# Patient Record
Sex: Female | Born: 1990 | Race: White | Hispanic: No | Marital: Married | Smoking: Never smoker
Health system: Southern US, Community
[De-identification: ages and names within clinical notes are randomized; demographics above are authoritative.]

## PROBLEM LIST (undated history)

## (undated) DIAGNOSIS — F419 Anxiety disorder, unspecified: Secondary | ICD-10-CM

## (undated) DIAGNOSIS — R112 Nausea with vomiting, unspecified: Secondary | ICD-10-CM

## (undated) DIAGNOSIS — K219 Gastro-esophageal reflux disease without esophagitis: Secondary | ICD-10-CM

## (undated) HISTORY — PX: BREAST SURGERY: SHX581

## (undated) HISTORY — PX: WISDOM TOOTH EXTRACTION: SHX21

---
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
## 2012-06-11 ENCOUNTER — Other Ambulatory Visit: Payer: Self-pay | Admitting: Physician Assistant

## 2012-06-13 LAB — CLOSTRIDIUM DIFFICILE BY PCR

## 2013-01-30 ENCOUNTER — Ambulatory Visit: Payer: Self-pay | Admitting: Internal Medicine

## 2014-12-26 ENCOUNTER — Ambulatory Visit: Payer: Self-pay

## 2016-03-31 ENCOUNTER — Other Ambulatory Visit: Payer: Self-pay | Admitting: Internal Medicine

## 2016-03-31 DIAGNOSIS — M545 Low back pain: Secondary | ICD-10-CM

## 2016-04-13 ENCOUNTER — Encounter: Payer: Self-pay | Admitting: Radiology

## 2016-04-13 ENCOUNTER — Ambulatory Visit
Admission: RE | Admit: 2016-04-13 | Discharge: 2016-04-13 | Disposition: A | Discharge status: Home / Self Care | Payer: Managed Care, Other (non HMO) | Source: Ambulatory Visit | Attending: Internal Medicine | Admitting: Internal Medicine

## 2016-04-13 DIAGNOSIS — M545 Low back pain: Secondary | ICD-10-CM

## 2016-04-13 DIAGNOSIS — M5136 Other intervertebral disc degeneration, lumbar region: Secondary | ICD-10-CM | POA: Insufficient documentation

## 2016-04-13 DIAGNOSIS — M48061 Spinal stenosis, lumbar region without neurogenic claudication: Secondary | ICD-10-CM | POA: Insufficient documentation

## 2017-03-24 DIAGNOSIS — R300 Dysuria: Secondary | ICD-10-CM | POA: Diagnosis not present

## 2017-03-28 DIAGNOSIS — J029 Acute pharyngitis, unspecified: Secondary | ICD-10-CM | POA: Diagnosis not present

## 2017-04-26 DIAGNOSIS — M5136 Other intervertebral disc degeneration, lumbar region: Secondary | ICD-10-CM | POA: Diagnosis not present

## 2017-04-26 DIAGNOSIS — M5416 Radiculopathy, lumbar region: Secondary | ICD-10-CM | POA: Diagnosis not present

## 2017-05-12 DIAGNOSIS — M5136 Other intervertebral disc degeneration, lumbar region: Secondary | ICD-10-CM | POA: Diagnosis not present

## 2017-05-12 DIAGNOSIS — M5441 Lumbago with sciatica, right side: Secondary | ICD-10-CM | POA: Diagnosis not present

## 2017-05-15 ENCOUNTER — Other Ambulatory Visit: Payer: Self-pay | Admitting: Family Medicine

## 2017-05-15 DIAGNOSIS — M5441 Lumbago with sciatica, right side: Secondary | ICD-10-CM

## 2017-05-15 DIAGNOSIS — M5136 Other intervertebral disc degeneration, lumbar region: Secondary | ICD-10-CM

## 2017-05-20 ENCOUNTER — Ambulatory Visit
Admission: RE | Admit: 2017-05-20 | Discharge: 2017-05-20 | Disposition: A | Discharge status: Home / Self Care | Payer: 59 | Source: Ambulatory Visit | Attending: Family Medicine | Admitting: Family Medicine

## 2017-05-20 DIAGNOSIS — M5441 Lumbago with sciatica, right side: Secondary | ICD-10-CM

## 2017-05-20 DIAGNOSIS — M5126 Other intervertebral disc displacement, lumbar region: Secondary | ICD-10-CM | POA: Diagnosis not present

## 2017-05-20 DIAGNOSIS — M5136 Other intervertebral disc degeneration, lumbar region: Secondary | ICD-10-CM | POA: Diagnosis present

## 2017-05-20 DIAGNOSIS — M545 Low back pain: Secondary | ICD-10-CM | POA: Diagnosis not present

## 2017-05-20 DIAGNOSIS — M48061 Spinal stenosis, lumbar region without neurogenic claudication: Secondary | ICD-10-CM | POA: Diagnosis not present

## 2017-05-24 DIAGNOSIS — M5136 Other intervertebral disc degeneration, lumbar region: Secondary | ICD-10-CM | POA: Diagnosis not present

## 2017-05-24 DIAGNOSIS — M5416 Radiculopathy, lumbar region: Secondary | ICD-10-CM | POA: Diagnosis not present

## 2017-05-24 DIAGNOSIS — M62838 Other muscle spasm: Secondary | ICD-10-CM | POA: Diagnosis not present

## 2017-06-19 DIAGNOSIS — G8929 Other chronic pain: Secondary | ICD-10-CM | POA: Diagnosis not present

## 2017-06-19 DIAGNOSIS — M545 Low back pain: Secondary | ICD-10-CM | POA: Diagnosis not present

## 2017-06-23 DIAGNOSIS — M5416 Radiculopathy, lumbar region: Secondary | ICD-10-CM | POA: Diagnosis not present

## 2017-06-23 DIAGNOSIS — M5136 Other intervertebral disc degeneration, lumbar region: Secondary | ICD-10-CM | POA: Diagnosis not present

## 2017-07-05 DIAGNOSIS — R202 Paresthesia of skin: Secondary | ICD-10-CM | POA: Diagnosis not present

## 2017-07-05 DIAGNOSIS — M542 Cervicalgia: Secondary | ICD-10-CM | POA: Diagnosis not present

## 2017-07-05 DIAGNOSIS — M5136 Other intervertebral disc degeneration, lumbar region: Secondary | ICD-10-CM | POA: Diagnosis not present

## 2017-07-05 DIAGNOSIS — M5441 Lumbago with sciatica, right side: Secondary | ICD-10-CM | POA: Diagnosis not present

## 2017-07-11 DIAGNOSIS — M5117 Intervertebral disc disorders with radiculopathy, lumbosacral region: Secondary | ICD-10-CM | POA: Diagnosis not present

## 2017-07-11 DIAGNOSIS — M9903 Segmental and somatic dysfunction of lumbar region: Secondary | ICD-10-CM | POA: Diagnosis not present

## 2017-07-11 DIAGNOSIS — M47817 Spondylosis without myelopathy or radiculopathy, lumbosacral region: Secondary | ICD-10-CM | POA: Diagnosis not present

## 2017-07-13 DIAGNOSIS — M5117 Intervertebral disc disorders with radiculopathy, lumbosacral region: Secondary | ICD-10-CM | POA: Diagnosis not present

## 2017-07-13 DIAGNOSIS — M9903 Segmental and somatic dysfunction of lumbar region: Secondary | ICD-10-CM | POA: Diagnosis not present

## 2017-07-13 DIAGNOSIS — M47817 Spondylosis without myelopathy or radiculopathy, lumbosacral region: Secondary | ICD-10-CM | POA: Diagnosis not present

## 2017-07-14 DIAGNOSIS — M5136 Other intervertebral disc degeneration, lumbar region: Secondary | ICD-10-CM | POA: Diagnosis not present

## 2017-07-14 DIAGNOSIS — M62838 Other muscle spasm: Secondary | ICD-10-CM | POA: Diagnosis not present

## 2017-07-14 DIAGNOSIS — M5416 Radiculopathy, lumbar region: Secondary | ICD-10-CM | POA: Diagnosis not present

## 2017-07-17 DIAGNOSIS — M5117 Intervertebral disc disorders with radiculopathy, lumbosacral region: Secondary | ICD-10-CM | POA: Diagnosis not present

## 2017-07-17 DIAGNOSIS — M47817 Spondylosis without myelopathy or radiculopathy, lumbosacral region: Secondary | ICD-10-CM | POA: Diagnosis not present

## 2017-07-17 DIAGNOSIS — M9903 Segmental and somatic dysfunction of lumbar region: Secondary | ICD-10-CM | POA: Diagnosis not present

## 2017-07-20 DIAGNOSIS — M47817 Spondylosis without myelopathy or radiculopathy, lumbosacral region: Secondary | ICD-10-CM | POA: Diagnosis not present

## 2017-07-20 DIAGNOSIS — G8929 Other chronic pain: Secondary | ICD-10-CM | POA: Diagnosis not present

## 2017-07-20 DIAGNOSIS — M5117 Intervertebral disc disorders with radiculopathy, lumbosacral region: Secondary | ICD-10-CM | POA: Diagnosis not present

## 2017-07-20 DIAGNOSIS — M5441 Lumbago with sciatica, right side: Secondary | ICD-10-CM | POA: Diagnosis not present

## 2017-07-20 DIAGNOSIS — M5442 Lumbago with sciatica, left side: Secondary | ICD-10-CM | POA: Diagnosis not present

## 2017-07-20 DIAGNOSIS — M9903 Segmental and somatic dysfunction of lumbar region: Secondary | ICD-10-CM | POA: Diagnosis not present

## 2017-07-24 DIAGNOSIS — M5117 Intervertebral disc disorders with radiculopathy, lumbosacral region: Secondary | ICD-10-CM | POA: Diagnosis not present

## 2017-07-24 DIAGNOSIS — M9903 Segmental and somatic dysfunction of lumbar region: Secondary | ICD-10-CM | POA: Diagnosis not present

## 2017-07-24 DIAGNOSIS — M47817 Spondylosis without myelopathy or radiculopathy, lumbosacral region: Secondary | ICD-10-CM | POA: Diagnosis not present

## 2017-07-27 DIAGNOSIS — M9903 Segmental and somatic dysfunction of lumbar region: Secondary | ICD-10-CM | POA: Diagnosis not present

## 2017-07-27 DIAGNOSIS — M5117 Intervertebral disc disorders with radiculopathy, lumbosacral region: Secondary | ICD-10-CM | POA: Diagnosis not present

## 2017-07-27 DIAGNOSIS — M47817 Spondylosis without myelopathy or radiculopathy, lumbosacral region: Secondary | ICD-10-CM | POA: Diagnosis not present

## 2017-07-31 DIAGNOSIS — M545 Low back pain: Secondary | ICD-10-CM | POA: Diagnosis not present

## 2017-07-31 DIAGNOSIS — G8929 Other chronic pain: Secondary | ICD-10-CM | POA: Diagnosis not present

## 2017-07-31 DIAGNOSIS — M5117 Intervertebral disc disorders with radiculopathy, lumbosacral region: Secondary | ICD-10-CM | POA: Diagnosis not present

## 2017-07-31 DIAGNOSIS — M47817 Spondylosis without myelopathy or radiculopathy, lumbosacral region: Secondary | ICD-10-CM | POA: Diagnosis not present

## 2017-07-31 DIAGNOSIS — M9903 Segmental and somatic dysfunction of lumbar region: Secondary | ICD-10-CM | POA: Diagnosis not present

## 2017-08-03 DIAGNOSIS — M47817 Spondylosis without myelopathy or radiculopathy, lumbosacral region: Secondary | ICD-10-CM | POA: Diagnosis not present

## 2017-08-03 DIAGNOSIS — M9903 Segmental and somatic dysfunction of lumbar region: Secondary | ICD-10-CM | POA: Diagnosis not present

## 2017-08-03 DIAGNOSIS — M5117 Intervertebral disc disorders with radiculopathy, lumbosacral region: Secondary | ICD-10-CM | POA: Diagnosis not present

## 2017-08-07 DIAGNOSIS — M9903 Segmental and somatic dysfunction of lumbar region: Secondary | ICD-10-CM | POA: Diagnosis not present

## 2017-08-07 DIAGNOSIS — M47817 Spondylosis without myelopathy or radiculopathy, lumbosacral region: Secondary | ICD-10-CM | POA: Diagnosis not present

## 2017-08-07 DIAGNOSIS — M5117 Intervertebral disc disorders with radiculopathy, lumbosacral region: Secondary | ICD-10-CM | POA: Diagnosis not present

## 2017-08-08 DIAGNOSIS — M545 Low back pain: Secondary | ICD-10-CM | POA: Diagnosis not present

## 2017-08-08 DIAGNOSIS — G8929 Other chronic pain: Secondary | ICD-10-CM | POA: Diagnosis not present

## 2017-08-10 DIAGNOSIS — M545 Low back pain: Secondary | ICD-10-CM | POA: Diagnosis not present

## 2017-08-10 DIAGNOSIS — G8929 Other chronic pain: Secondary | ICD-10-CM | POA: Diagnosis not present

## 2017-08-15 DIAGNOSIS — M5117 Intervertebral disc disorders with radiculopathy, lumbosacral region: Secondary | ICD-10-CM | POA: Diagnosis not present

## 2017-08-15 DIAGNOSIS — M47817 Spondylosis without myelopathy or radiculopathy, lumbosacral region: Secondary | ICD-10-CM | POA: Diagnosis not present

## 2017-08-15 DIAGNOSIS — M9903 Segmental and somatic dysfunction of lumbar region: Secondary | ICD-10-CM | POA: Diagnosis not present

## 2017-08-16 DIAGNOSIS — G8929 Other chronic pain: Secondary | ICD-10-CM | POA: Diagnosis not present

## 2017-08-16 DIAGNOSIS — M545 Low back pain: Secondary | ICD-10-CM | POA: Diagnosis not present

## 2017-08-21 DIAGNOSIS — G8929 Other chronic pain: Secondary | ICD-10-CM | POA: Diagnosis not present

## 2017-08-21 DIAGNOSIS — M545 Low back pain: Secondary | ICD-10-CM | POA: Diagnosis not present

## 2017-08-25 DIAGNOSIS — M545 Low back pain: Secondary | ICD-10-CM | POA: Diagnosis not present

## 2017-08-25 DIAGNOSIS — G8929 Other chronic pain: Secondary | ICD-10-CM | POA: Diagnosis not present

## 2017-08-28 DIAGNOSIS — M545 Low back pain: Secondary | ICD-10-CM | POA: Diagnosis not present

## 2017-08-28 DIAGNOSIS — G8929 Other chronic pain: Secondary | ICD-10-CM | POA: Diagnosis not present

## 2017-08-31 DIAGNOSIS — M9903 Segmental and somatic dysfunction of lumbar region: Secondary | ICD-10-CM | POA: Diagnosis not present

## 2017-08-31 DIAGNOSIS — G8929 Other chronic pain: Secondary | ICD-10-CM | POA: Diagnosis not present

## 2017-08-31 DIAGNOSIS — M545 Low back pain: Secondary | ICD-10-CM | POA: Diagnosis not present

## 2017-08-31 DIAGNOSIS — M47817 Spondylosis without myelopathy or radiculopathy, lumbosacral region: Secondary | ICD-10-CM | POA: Diagnosis not present

## 2017-08-31 DIAGNOSIS — M5117 Intervertebral disc disorders with radiculopathy, lumbosacral region: Secondary | ICD-10-CM | POA: Diagnosis not present

## 2017-09-14 DIAGNOSIS — M47817 Spondylosis without myelopathy or radiculopathy, lumbosacral region: Secondary | ICD-10-CM | POA: Diagnosis not present

## 2017-09-14 DIAGNOSIS — M9903 Segmental and somatic dysfunction of lumbar region: Secondary | ICD-10-CM | POA: Diagnosis not present

## 2017-09-14 DIAGNOSIS — M5117 Intervertebral disc disorders with radiculopathy, lumbosacral region: Secondary | ICD-10-CM | POA: Diagnosis not present

## 2017-09-29 DIAGNOSIS — M545 Low back pain: Secondary | ICD-10-CM | POA: Diagnosis not present

## 2017-09-29 DIAGNOSIS — G8929 Other chronic pain: Secondary | ICD-10-CM | POA: Diagnosis not present

## 2017-09-29 DIAGNOSIS — M25551 Pain in right hip: Secondary | ICD-10-CM | POA: Diagnosis not present

## 2017-10-03 DIAGNOSIS — M9903 Segmental and somatic dysfunction of lumbar region: Secondary | ICD-10-CM | POA: Diagnosis not present

## 2017-10-03 DIAGNOSIS — M47817 Spondylosis without myelopathy or radiculopathy, lumbosacral region: Secondary | ICD-10-CM | POA: Diagnosis not present

## 2017-10-03 DIAGNOSIS — M5117 Intervertebral disc disorders with radiculopathy, lumbosacral region: Secondary | ICD-10-CM | POA: Diagnosis not present

## 2017-10-06 DIAGNOSIS — M545 Low back pain: Secondary | ICD-10-CM | POA: Diagnosis not present

## 2017-10-06 DIAGNOSIS — G8929 Other chronic pain: Secondary | ICD-10-CM | POA: Diagnosis not present

## 2017-10-06 DIAGNOSIS — M533 Sacrococcygeal disorders, not elsewhere classified: Secondary | ICD-10-CM | POA: Diagnosis not present

## 2017-10-09 DIAGNOSIS — Z23 Encounter for immunization: Secondary | ICD-10-CM | POA: Diagnosis not present

## 2017-10-09 DIAGNOSIS — R1084 Generalized abdominal pain: Secondary | ICD-10-CM | POA: Diagnosis not present

## 2017-10-09 DIAGNOSIS — M545 Low back pain: Secondary | ICD-10-CM | POA: Diagnosis not present

## 2017-10-11 ENCOUNTER — Other Ambulatory Visit: Payer: Self-pay | Admitting: Internal Medicine

## 2017-10-11 DIAGNOSIS — R100 Acute abdomen: Secondary | ICD-10-CM

## 2017-10-13 DIAGNOSIS — M533 Sacrococcygeal disorders, not elsewhere classified: Secondary | ICD-10-CM | POA: Diagnosis not present

## 2017-10-13 DIAGNOSIS — M545 Low back pain: Secondary | ICD-10-CM | POA: Diagnosis not present

## 2017-10-13 DIAGNOSIS — G8929 Other chronic pain: Secondary | ICD-10-CM | POA: Diagnosis not present

## 2017-11-21 DIAGNOSIS — M545 Low back pain: Secondary | ICD-10-CM | POA: Diagnosis not present

## 2017-11-21 DIAGNOSIS — G8929 Other chronic pain: Secondary | ICD-10-CM | POA: Diagnosis not present

## 2017-12-07 DIAGNOSIS — M47817 Spondylosis without myelopathy or radiculopathy, lumbosacral region: Secondary | ICD-10-CM | POA: Diagnosis not present

## 2017-12-07 DIAGNOSIS — M5117 Intervertebral disc disorders with radiculopathy, lumbosacral region: Secondary | ICD-10-CM | POA: Diagnosis not present

## 2017-12-07 DIAGNOSIS — M9903 Segmental and somatic dysfunction of lumbar region: Secondary | ICD-10-CM | POA: Diagnosis not present

## 2017-12-14 DIAGNOSIS — M461 Sacroiliitis, not elsewhere classified: Secondary | ICD-10-CM | POA: Diagnosis not present

## 2018-01-09 DIAGNOSIS — O418X10 Other specified disorders of amniotic fluid and membranes, first trimester, not applicable or unspecified: Secondary | ICD-10-CM | POA: Diagnosis not present

## 2018-01-09 DIAGNOSIS — N912 Amenorrhea, unspecified: Secondary | ICD-10-CM | POA: Diagnosis not present

## 2018-01-09 DIAGNOSIS — O468X1 Other antepartum hemorrhage, first trimester: Secondary | ICD-10-CM | POA: Diagnosis not present

## 2018-02-08 DIAGNOSIS — O418X10 Other specified disorders of amniotic fluid and membranes, first trimester, not applicable or unspecified: Secondary | ICD-10-CM | POA: Diagnosis not present

## 2018-02-08 DIAGNOSIS — Z3401 Encounter for supervision of normal first pregnancy, first trimester: Secondary | ICD-10-CM | POA: Diagnosis not present

## 2018-02-08 DIAGNOSIS — O468X1 Other antepartum hemorrhage, first trimester: Secondary | ICD-10-CM | POA: Diagnosis not present

## 2018-03-05 DIAGNOSIS — J029 Acute pharyngitis, unspecified: Secondary | ICD-10-CM | POA: Diagnosis not present

## 2018-03-17 ENCOUNTER — Other Ambulatory Visit: Payer: Self-pay

## 2018-03-17 ENCOUNTER — Emergency Department: Payer: 59

## 2018-03-17 ENCOUNTER — Emergency Department
Admission: EM | Admit: 2018-03-17 | Discharge: 2018-03-17 | Disposition: A | Discharge status: Home / Self Care | Payer: 59 | Attending: Emergency Medicine | Admitting: Emergency Medicine

## 2018-03-17 DIAGNOSIS — R0602 Shortness of breath: Secondary | ICD-10-CM | POA: Insufficient documentation

## 2018-03-17 DIAGNOSIS — R0981 Nasal congestion: Secondary | ICD-10-CM | POA: Diagnosis not present

## 2018-03-17 DIAGNOSIS — Z3492 Encounter for supervision of normal pregnancy, unspecified, second trimester: Secondary | ICD-10-CM | POA: Diagnosis not present

## 2018-03-17 DIAGNOSIS — Z3A16 16 weeks gestation of pregnancy: Secondary | ICD-10-CM | POA: Insufficient documentation

## 2018-03-17 DIAGNOSIS — R42 Dizziness and giddiness: Secondary | ICD-10-CM | POA: Diagnosis not present

## 2018-03-17 DIAGNOSIS — R05 Cough: Secondary | ICD-10-CM | POA: Insufficient documentation

## 2018-03-17 DIAGNOSIS — O99512 Diseases of the respiratory system complicating pregnancy, second trimester: Secondary | ICD-10-CM | POA: Diagnosis not present

## 2018-03-17 DIAGNOSIS — B9789 Other viral agents as the cause of diseases classified elsewhere: Secondary | ICD-10-CM | POA: Diagnosis not present

## 2018-03-17 DIAGNOSIS — O9989 Other specified diseases and conditions complicating pregnancy, childbirth and the puerperium: Secondary | ICD-10-CM | POA: Diagnosis not present

## 2018-03-17 DIAGNOSIS — J069 Acute upper respiratory infection, unspecified: Secondary | ICD-10-CM

## 2018-03-17 LAB — CBC
HCT: 38.1 % (ref 36.0–46.0)
Hemoglobin: 12.9 g/dL (ref 12.0–15.0)
MCH: 30.4 pg (ref 26.0–34.0)
MCHC: 33.9 g/dL (ref 30.0–36.0)
MCV: 89.9 fL (ref 80.0–100.0)
Platelets: 303 10*3/uL (ref 150–400)
RBC: 4.24 MIL/uL (ref 3.87–5.11)
RDW: 12.4 % (ref 11.5–15.5)
WBC: 11.2 10*3/uL — ABNORMAL HIGH (ref 4.0–10.5)
nRBC: 0 % (ref 0.0–0.2)

## 2018-03-17 LAB — URINALYSIS, COMPLETE (UACMP) WITH MICROSCOPIC
Bilirubin Urine: NEGATIVE
Glucose, UA: NEGATIVE mg/dL
Hgb urine dipstick: NEGATIVE
Ketones, ur: NEGATIVE mg/dL
Leukocytes, UA: NEGATIVE
Nitrite: NEGATIVE
Protein, ur: NEGATIVE mg/dL
SPECIFIC GRAVITY, URINE: 1.014 (ref 1.005–1.030)
pH: 6 (ref 5.0–8.0)

## 2018-03-17 LAB — HCG, QUANTITATIVE, PREGNANCY: hCG, Beta Chain, Quant, S: 20378 m[IU]/mL — ABNORMAL HIGH (ref <5)

## 2018-03-17 LAB — BASIC METABOLIC PANEL
Anion gap: 7 (ref 5–15)
BUN: 10 mg/dL (ref 6–20)
CO2: 21 mmol/L — ABNORMAL LOW (ref 22–32)
Calcium: 9.1 mg/dL (ref 8.9–10.3)
Chloride: 106 mmol/L (ref 98–111)
Creatinine, Ser: 0.54 mg/dL (ref 0.44–1.00)
GFR calc Af Amer: >60 mL/min (ref >60)
Glucose, Bld: 90 mg/dL (ref 70–99)
Potassium: 3.6 mmol/L (ref 3.5–5.1)
Sodium: 134 mmol/L — ABNORMAL LOW (ref 135–145)

## 2018-03-17 LAB — INFLUENZA PANEL BY PCR (TYPE A & B)
INFLBPCR: NEGATIVE
Influenza A By PCR: NEGATIVE

## 2018-03-17 LAB — TROPONIN I: Troponin I: <0.03 ng/mL (ref <0.03)

## 2018-03-17 MED ORDER — SODIUM CHLORIDE 0.9 % IV BOLUS
1000.0000 mL | Freq: Once | INTRAVENOUS | Status: AC
  Administered 2018-03-17: 1000 mL via INTRAVENOUS

## 2018-03-17 NOTE — ED Provider Notes (Signed)
[facility] Emergency Department Provider Note  ____________________________________________  Time seen: Approximately 3:01 PM  I have reviewed the triage vital signs and the nursing notes.   HISTORY  Chief Complaint Shortness of Breath; Dizziness; and Cough   HPI Molly Morton is a 27 y.o. female G1, P0 at 2716 weeks gestational age with no significant past medical history who presents for evaluation of dizziness.  Patient reports that she has had 2 weeks of cold-like symptoms with congestion, postnasal drip, cough.  She finished a course of Z-Pak that was given to her by her OB/GYN yesterday.  She reports that her symptoms improved but she still congested and is still having a dry cough which is usually worse at nighttime due to postnasal drip.  This morning when she woke up she reports feeling like she was drunk/off balance.  She has been having intermittent episodes of feeling like that throughout the day today.  She reports that every time she feels like that she gets very anxious.  She does have a history of anxiety.  She reports hyperventilating which makes her feel short of breath and she develops chest pain.  She reports that when she calms down her shortness of breath and chest pain go away.  She denies fever or chills, nausea, vomiting, diarrhea, vaginal bleeding or vaginal discharge.  No dysuria or hematuria.  No personal or family history of blood clots, recent travel immobilization, leg pain or swelling, hemoptysis, exogenous hormones, or history of cancer.  Patient does report feeling that her anxiety is more pronounced since she became pregnant.  She is currently not on any medications for it.  PMH Anxiety  Allergies Patient has no allergy information on record.  FH No PE/ DVT  Social History Smoking - no Alcohol - no Drug - no   Review of Systems  Constitutional: Negative for fever. + dizziness Eyes: Negative for visual changes. ENT: +  congestion. Neck: No neck pain  Cardiovascular: Negative for chest pain. Respiratory: Negative for shortness of breath. + cough Gastrointestinal: Negative for abdominal pain, vomiting or diarrhea. Genitourinary: Negative for dysuria. Musculoskeletal: Negative for back pain. Skin: Negative for rash. Neurological: Negative for headaches, weakness or numbness. Psych: No SI or HI. + anxiety  ____________________________________________   PHYSICAL EXAM:  VITAL SIGNS: ED Triage Vitals  Enc Vitals Group     BP 03/17/18 1107 131/78     Pulse Rate 03/17/18 1107 86     Resp 03/17/18 1107 18     Temp 03/17/18 1107 98.2 F (36.8 C)     Temp Source 03/17/18 1107 Oral     SpO2 03/17/18 1107 98 %     Weight 03/17/18 1104 190 lb (86.2 kg)     Height 03/17/18 1104 5\' 6"  (1.676 m)     Head Circumference --      Peak Flow --      Pain Score 03/17/18 1104 0     Pain Loc --      Pain Edu? --      Excl. in GC? --     Constitutional: Alert and oriented. Well appearing and in no apparent distress. HEENT:      Head: Normocephalic and atraumatic.         Eyes: Conjunctivae are normal. Sclera is non-icteric.       Mouth/Throat: Mucous membranes are moist.       Ears: Bilateral TMs visualized and clear      Neck: Supple with no  signs of meningismus. Cardiovascular: Regular rate and rhythm. No murmurs, gallops, or rubs. 2+ symmetrical distal pulses are present in all extremities. No JVD. Respiratory: Normal respiratory effort. Lungs are clear to auscultation bilaterally. No wheezes, crackles, or rhonchi.  Gastrointestinal: Soft, non tender, and non distended with positive bowel sounds. No rebound or guarding. Musculoskeletal: Nontender with normal range of motion in all extremities. No edema, cyanosis, or erythema of extremities. Neurologic: Normal speech and language. Face is symmetric. Moving all extremities. No gross focal neurologic deficits are appreciated. Skin: Skin is warm, dry and intact.  No rash noted. Psychiatric: Mood and affect are normal. Speech and behavior are normal.  ____________________________________________   LABS (all labs ordered are listed, but only abnormal results are displayed)  Labs Reviewed  BASIC METABOLIC PANEL - Abnormal; Notable for the following components:      Result Value   Sodium 134 (*)    CO2 21 (*)    All other components within normal limits  CBC - Abnormal; Notable for the following components:   WBC 11.2 (*)    All other components within normal limits  URINALYSIS, COMPLETE (UACMP) WITH MICROSCOPIC - Abnormal; Notable for the following components:   Color, Urine YELLOW (*)    APPearance CLEAR (*)    Bacteria, UA RARE (*)    All other components within normal limits  HCG, QUANTITATIVE, PREGNANCY - Abnormal; Notable for the following components:   hCG, Beta Chain, Quant, S 20,378 (*)    All other components within normal limits  TROPONIN I  INFLUENZA PANEL BY PCR (TYPE A & B)   ____________________________________________  EKG  ED ECG REPORT I, Nita Sicklearolina Darly Fails, the attending physician, personally viewed and interpreted this ECG.  Normal sinus rhythm, rate of 83, normal intervals, normal axis, no ST elevations or depressions.  Normal EKG.  No prior for comparison. ____________________________________________  RADIOLOGY  I have personally reviewed the images performed during this visit and I agree with the Radiologist's read.   Interpretation by Radiologist:  Dg Chest 2 View  Result Date: 03/17/2018 CLINICAL DATA:  Patient with shortness of breath. EXAM: CHEST - 2 VIEW COMPARISON:  None. FINDINGS: Normal cardiac and mediastinal contours. No consolidative pulmonary opacities. No pleural effusion or pneumothorax. Thoracic spine degenerative changes. IMPRESSION: No acute cardiopulmonary process. Electronically Signed   By: Annia Beltrew  Davis M.D.   On: 03/17/2018 12:21    ____________________________________________   PROCEDURES  Procedure(s) performed: None Procedures Critical Care performed:  None ____________________________________________   INITIAL IMPRESSION / ASSESSMENT AND PLAN / ED COURSE  27 y.o. female G1, P0 at 2716 weeks gestational age with no significant past medical history who presents for evaluation of dizziness, cough, congestion, anxiety.  Patient is extremely well-appearing in no distress, normal work of breathing, normal sats, lungs are clear to auscultation, she looks well-hydrated.  Patient does have a dry cough and is very congested which could be causing her dizziness/vertigo symptoms.  There is no neuro deficits otherwise.  Her presentation looks like a viral URI.  Chest x-ray no evidence of pneumonia.  Labs with no evidence of dehydration AKI.  UA negative for UTI.  Flu is pending. No CP, tachypnea, tachycardia or hypoxia therefore low concern for PE.  Orthostatic vital signs slightly positive with mildly elevated heart rate but unchanged blood pressure.  Will give IV fluids.  Clinical Course as of Mar 18 1599  Sat Mar 17, 2018  1559 Flu negative.  Patient feels better after IV fluids.  Bedside ultrasound  showing IUP with great fetal movement and heart rate of 146.  Recommend increase oral hydration and rest for the next 2 days.  Recommend close follow-up with primary care doctor.  Discussed return precautions.   [CV]    Clinical Course User Index [CV] Don Perking [place], MD     As part of my medical decision making, I reviewed the following data within the electronic MEDICAL RECORD NUMBER Nursing notes reviewed and incorporated, Labs reviewed , EKG interpreted , Radiograph reviewed , Notes from prior ED visits and [place] Controlled Substance Database    Pertinent labs & imaging results that were available during my care of the patient were reviewed by me and considered in my medical decision making (see chart for  details).    ____________________________________________   FINAL CLINICAL IMPRESSION(S) / ED DIAGNOSES  Final diagnoses:  Viral upper respiratory tract infection      NEW MEDICATIONS STARTED DURING THIS VISIT:  ED Discharge Orders    None       Note:  This document was prepared using Dragon voice recognition software and may include unintentional dictation errors.    Don Perking, [place], MD 03/17/18 1600

## 2018-03-17 NOTE — ED Notes (Signed)
FHR 125

## 2018-03-17 NOTE — ED Notes (Signed)
ED Provider at bedside. 

## 2018-03-17 NOTE — ED Triage Notes (Signed)
Pt comes via POV from home with c/o SHOB, cough and dizziness. Pt states this all has been going on for couple of days but got worse this am.  Pt states this am she woke up and felt dizzy while laying down. Pt states productive cough with clear mucus.  Pt is [redacted] weeks pregnant [facility]

## 2018-04-05 DIAGNOSIS — R002 Palpitations: Secondary | ICD-10-CM | POA: Diagnosis not present

## 2018-04-05 DIAGNOSIS — Z23 Encounter for immunization: Secondary | ICD-10-CM | POA: Diagnosis not present

## 2018-05-23 DIAGNOSIS — H9203 Otalgia, bilateral: Secondary | ICD-10-CM | POA: Diagnosis not present

## 2018-05-23 DIAGNOSIS — J029 Acute pharyngitis, unspecified: Secondary | ICD-10-CM | POA: Diagnosis not present

## 2018-05-23 DIAGNOSIS — R1310 Dysphagia, unspecified: Secondary | ICD-10-CM | POA: Diagnosis not present

## 2018-06-14 DIAGNOSIS — Z23 Encounter for immunization: Secondary | ICD-10-CM | POA: Diagnosis not present

## 2018-06-14 DIAGNOSIS — Z3402 Encounter for supervision of normal first pregnancy, second trimester: Secondary | ICD-10-CM | POA: Diagnosis not present

## 2018-06-15 DIAGNOSIS — R102 Pelvic and perineal pain: Secondary | ICD-10-CM | POA: Diagnosis not present

## 2018-06-15 DIAGNOSIS — R3989 Other symptoms and signs involving the genitourinary system: Secondary | ICD-10-CM | POA: Diagnosis not present

## 2018-07-13 IMAGING — MR MR LUMBAR SPINE W/O CM
5 series · 37 of 48 positions shown · non-contrast
Comparison: 04/13/2016

CLINICAL DATA: Low back pain radiating to the right and left with
pain shooting down the right leg.

EXAM:
MRI LUMBAR SPINE WITHOUT CONTRAST
TECHNIQUE: Multiplanar, multisequence MR imaging of the lumbar spine was
performed. No intravenous contrast was administered.

[Series 2: T2 · sagittal · 4.0mm · 0.81mm/px · 6 of 15 slices shown (1 of 2)]
[im 1/15]
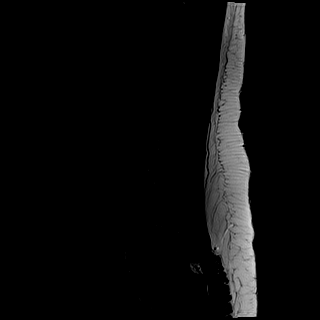
[im 3/15]
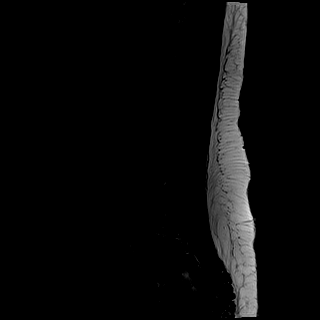
[im 6/15]
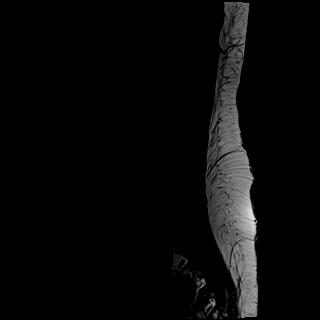
[im 9/15]
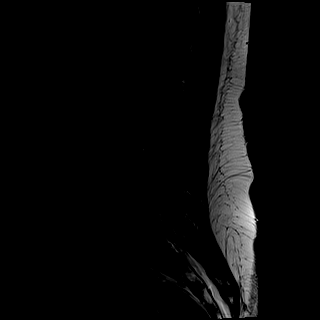
[im 12/15]
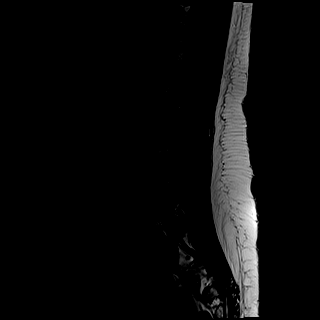
[im 15/15]
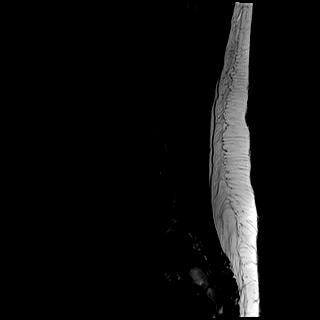

[Series 3: T1 · sagittal · 4.0mm · 0.81mm/px · 6 of 15 slices shown (1 of 2)]
[im 1/15]
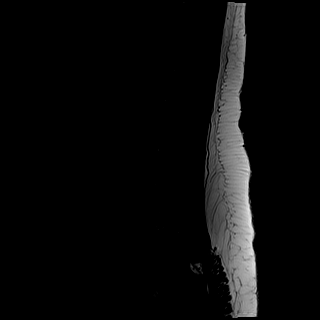
[im 3/15]
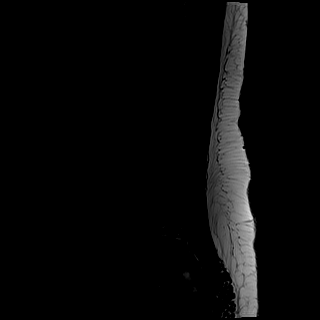
[im 6/15]
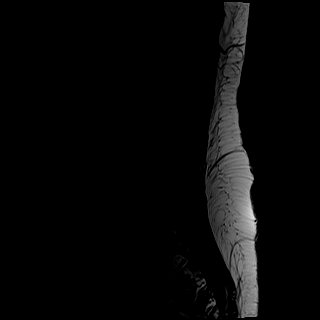
[im 9/15]
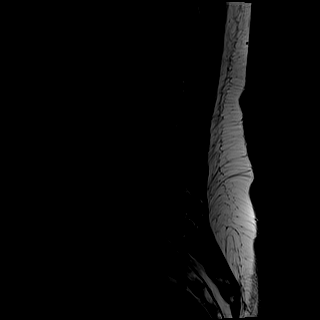
[im 12/15]
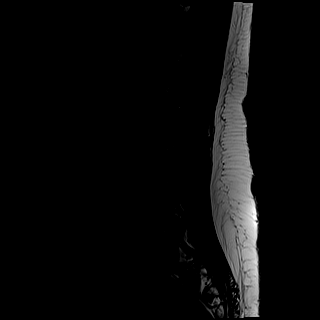
[im 15/15]
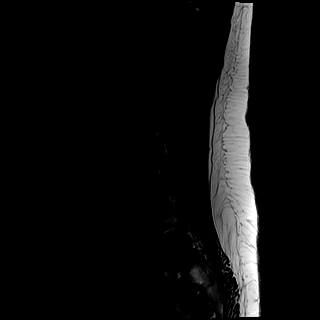

[Series 4: STIR · sagittal · 4.0mm · 1.02mm/px · 6 of 15 slices shown]
[im 1/15]
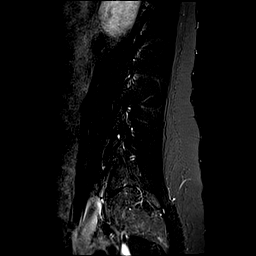
[im 3/15]
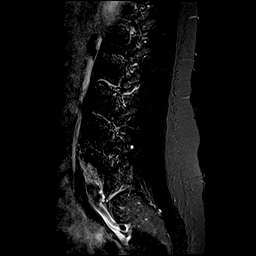
[im 6/15]
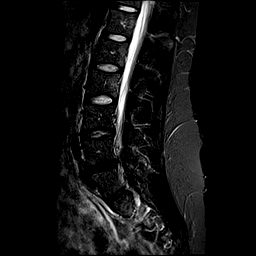
[im 9/15]
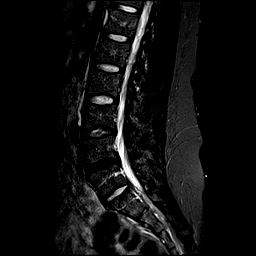
[im 12/15]
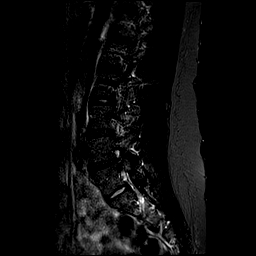
[im 15/15]
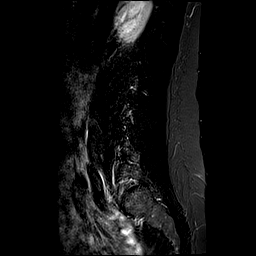

[Series 5: T2 · axial · 4.0mm · 0.78mm/px · z∈[-57,+153]mm · 10 of 35 slices shown (2 of 2)]
[im 1/35]
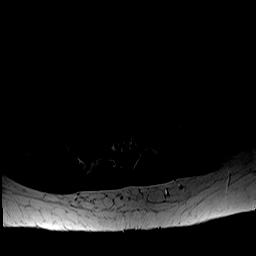
[im 3/35]
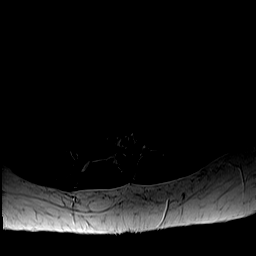
[im 5/35]
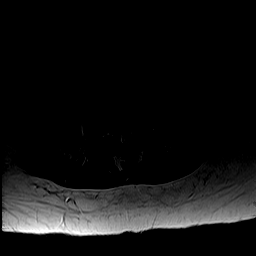
[im 10/35]
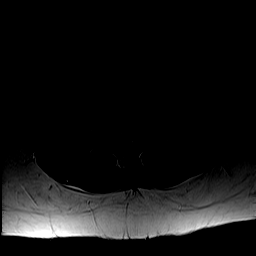
[im 15/35]
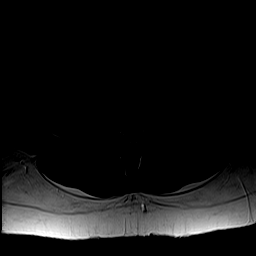
[im 18/35]
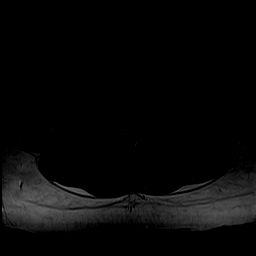
[im 20/35]
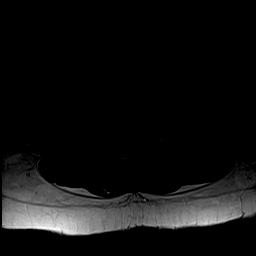
[im 25/35]
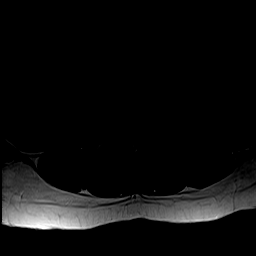
[im 30/35]
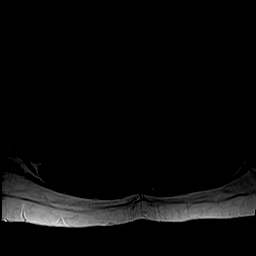
[im 35/35]
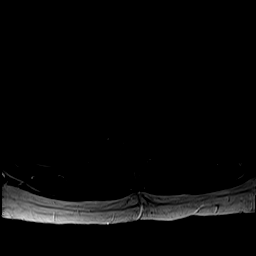

[Series 6: T1 · axial · 4.0mm · 0.39mm/px · z∈[-57,+153]mm · 9 of 35 slices shown (2 of 2)]
[im 1/35]
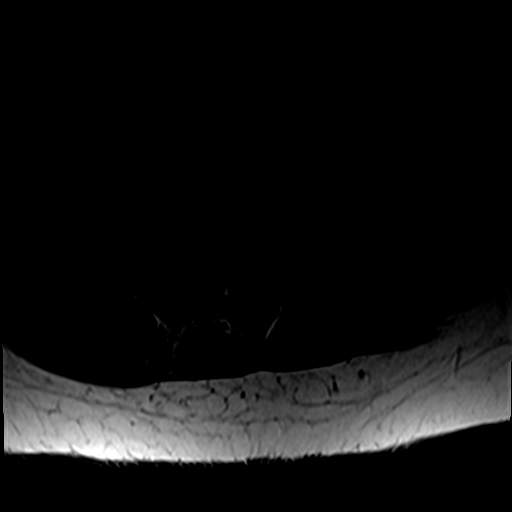
[im 5/35]
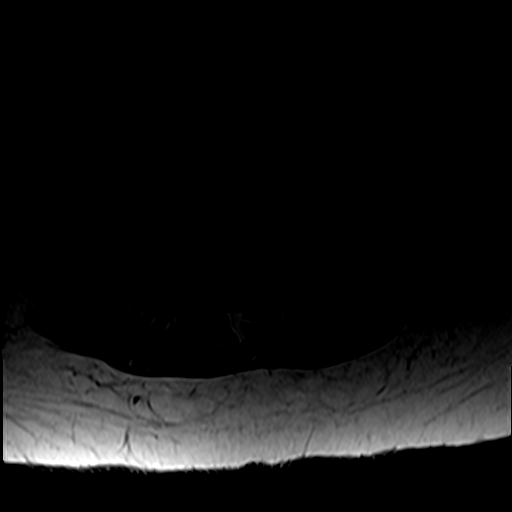
[im 10/35]
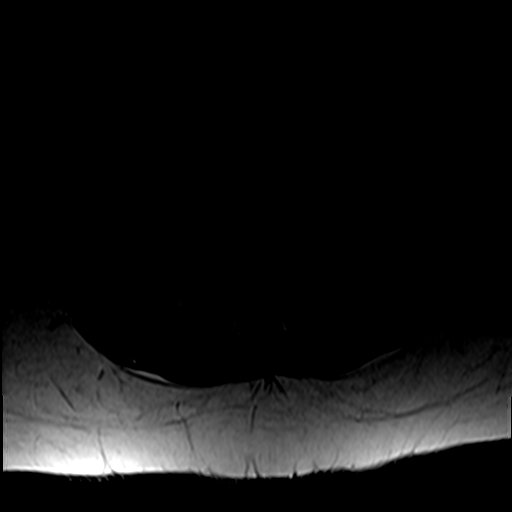
[im 15/35]
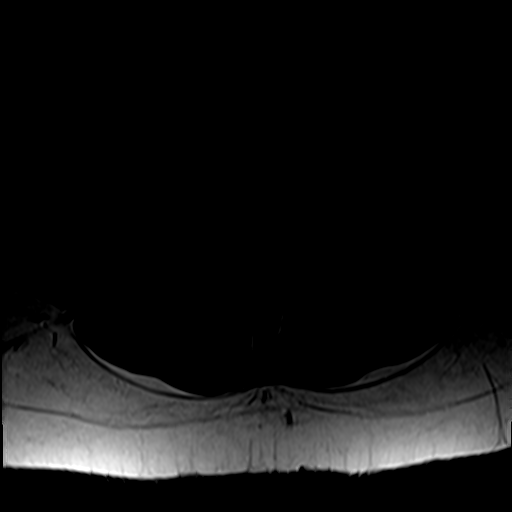
[im 18/35]
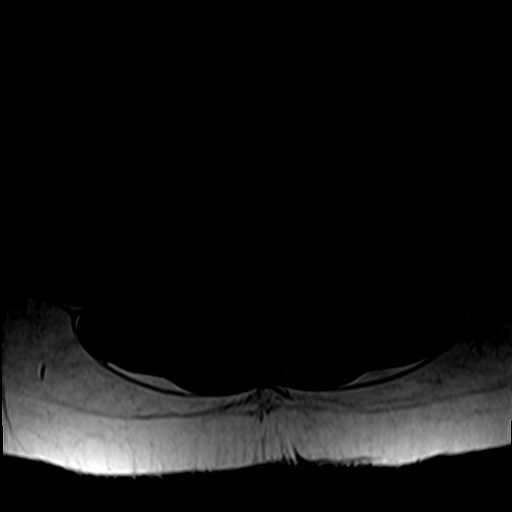
[im 20/35]
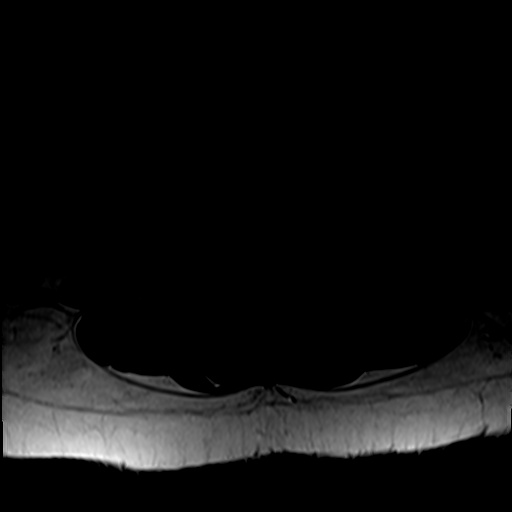
[im 25/35]
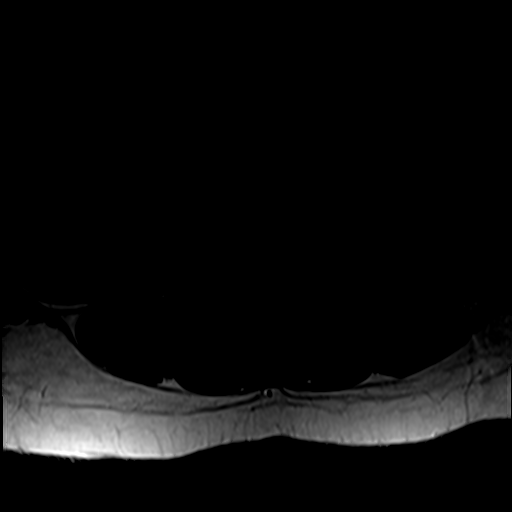
[im 30/35]
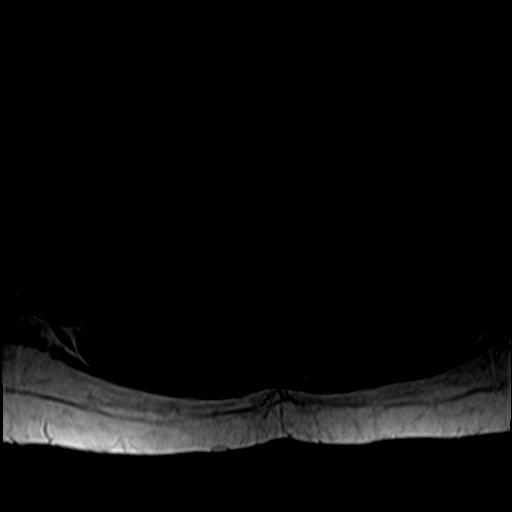
[im 35/35]
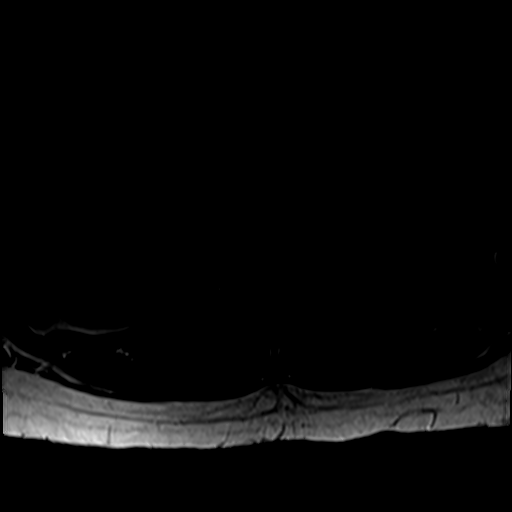

[37 of 48 positions shown; findings below may reference images not displayed]

FINDINGS: Segmentation:  Standard.

Alignment:  Physiologic.

Vertebrae:  No fracture, evidence of discitis, or bone lesion.

Conus medullaris and cauda equina: Conus extends to the T11-12
level. Conus and cauda equina appear normal.

Paraspinal and other soft tissues: No acute paraspinal abnormality.

Disc levels:

Disc spaces: Degenerative disc disease with mild disc height loss
and disc desiccation at L4-5.

T12-L1: No significant disc bulge. No evidence of neural foraminal
stenosis. No central canal stenosis.

L1-L2: No significant disc bulge. No evidence of neural foraminal
stenosis. No central canal stenosis.

L2-L3: No significant disc bulge. No evidence of neural foraminal
stenosis. No central canal stenosis.

L3-L4: Mild broad-based disc bulge. No evidence of neural foraminal
stenosis. No central canal stenosis.

L4-L5: Broad-based disc bulge with a left paracentral disc
protrusion and annular fissure. Left lateral recess stenosis. No
evidence of neural foraminal stenosis. No central canal stenosis.

L5-S1: No significant disc bulge. No evidence of neural foraminal
stenosis. No central canal stenosis. Mild left facet arthropathy.
IMPRESSION: 1. At L4-5 there is a broad-based disc bulge with a left paracentral
disc protrusion and annular fissure. Left lateral recess stenosis.

## 2018-07-17 DIAGNOSIS — Z3482 Encounter for supervision of other normal pregnancy, second trimester: Secondary | ICD-10-CM | POA: Diagnosis not present

## 2018-07-17 DIAGNOSIS — Z3483 Encounter for supervision of other normal pregnancy, third trimester: Secondary | ICD-10-CM | POA: Diagnosis not present

## 2018-08-08 DIAGNOSIS — R8290 Unspecified abnormal findings in urine: Secondary | ICD-10-CM | POA: Diagnosis not present

## 2018-08-08 DIAGNOSIS — Z3403 Encounter for supervision of normal first pregnancy, third trimester: Secondary | ICD-10-CM | POA: Diagnosis not present

## 2018-08-20 ENCOUNTER — Inpatient Hospital Stay
Admission: EM | Admit: 2018-08-20 | Discharge: 2018-08-24 | DRG: 787 | Disposition: A | Discharge status: Home / Self Care | Payer: 59 | Attending: Obstetrics and Gynecology | Admitting: Obstetrics and Gynecology

## 2018-08-20 ENCOUNTER — Other Ambulatory Visit: Payer: Self-pay

## 2018-08-20 DIAGNOSIS — O99344 Other mental disorders complicating childbirth: Secondary | ICD-10-CM | POA: Diagnosis present

## 2018-08-20 DIAGNOSIS — O99824 Streptococcus B carrier state complicating childbirth: Secondary | ICD-10-CM | POA: Diagnosis present

## 2018-08-20 DIAGNOSIS — O339 Maternal care for disproportion, unspecified: Secondary | ICD-10-CM | POA: Diagnosis present

## 2018-08-20 DIAGNOSIS — Z3A38 38 weeks gestation of pregnancy: Secondary | ICD-10-CM | POA: Diagnosis not present

## 2018-08-20 DIAGNOSIS — F419 Anxiety disorder, unspecified: Secondary | ICD-10-CM | POA: Diagnosis present

## 2018-08-20 DIAGNOSIS — Z1159 Encounter for screening for other viral diseases: Secondary | ICD-10-CM | POA: Diagnosis not present

## 2018-08-20 DIAGNOSIS — R030 Elevated blood-pressure reading, without diagnosis of hypertension: Secondary | ICD-10-CM | POA: Diagnosis present

## 2018-08-20 DIAGNOSIS — E669 Obesity, unspecified: Secondary | ICD-10-CM | POA: Diagnosis present

## 2018-08-20 DIAGNOSIS — O9081 Anemia of the puerperium: Secondary | ICD-10-CM | POA: Diagnosis not present

## 2018-08-20 DIAGNOSIS — D62 Acute posthemorrhagic anemia: Secondary | ICD-10-CM | POA: Diagnosis not present

## 2018-08-20 DIAGNOSIS — O134 Gestational [pregnancy-induced] hypertension without significant proteinuria, complicating childbirth: Principal | ICD-10-CM | POA: Diagnosis present

## 2018-08-20 DIAGNOSIS — Z9889 Other specified postprocedural states: Secondary | ICD-10-CM

## 2018-08-20 DIAGNOSIS — O99214 Obesity complicating childbirth: Secondary | ICD-10-CM | POA: Diagnosis present

## 2018-08-20 HISTORY — DX: Anxiety disorder, unspecified: F41.9

## 2018-08-20 LAB — COMPREHENSIVE METABOLIC PANEL
ALT: 15 U/L (ref 0–44)
AST: 19 U/L (ref 15–41)
Albumin: 3 g/dL — ABNORMAL LOW (ref 3.5–5.0)
Alkaline Phosphatase: 117 U/L (ref 38–126)
Anion gap: 9 (ref 5–15)
BUN: 13 mg/dL (ref 6–20)
CO2: 20 mmol/L — ABNORMAL LOW (ref 22–32)
Calcium: 8.3 mg/dL — ABNORMAL LOW (ref 8.9–10.3)
Chloride: 105 mmol/L (ref 98–111)
Creatinine, Ser: 0.62 mg/dL (ref 0.44–1.00)
GFR calc Af Amer: >60 mL/min (ref >60)
GFR calc non Af Amer: >60 mL/min (ref >60)
Glucose, Bld: 78 mg/dL (ref 70–99)
Potassium: 3.9 mmol/L (ref 3.5–5.1)
Sodium: 134 mmol/L — ABNORMAL LOW (ref 135–145)
Total Bilirubin: 0.4 mg/dL (ref 0.3–1.2)
Total Protein: 6 g/dL — ABNORMAL LOW (ref 6.5–8.1)

## 2018-08-20 LAB — CBC
HCT: 34.1 % — ABNORMAL LOW (ref 36.0–46.0)
Hemoglobin: 11.6 g/dL — ABNORMAL LOW (ref 12.0–15.0)
MCH: 29.8 pg (ref 26.0–34.0)
MCHC: 34 g/dL (ref 30.0–36.0)
MCV: 87.7 fL (ref 80.0–100.0)
Platelets: 213 10*3/uL (ref 150–400)
RBC: 3.89 MIL/uL (ref 3.87–5.11)
RDW: 13.3 % (ref 11.5–15.5)
WBC: 11.8 10*3/uL — ABNORMAL HIGH (ref 4.0–10.5)
nRBC: 0 % (ref 0.0–0.2)

## 2018-08-20 LAB — PROTEIN / CREATININE RATIO, URINE
Creatinine, Urine: 119 mg/dL
Protein Creatinine Ratio: 0.08 mg/mg{Cre} (ref 0.00–0.15)
Total Protein, Urine: 10 mg/dL

## 2018-08-20 LAB — TYPE AND SCREEN
ABO/RH(D): O POS
Antibody Screen: NEGATIVE

## 2018-08-20 LAB — SARS CORONAVIRUS 2 BY RT PCR (HOSPITAL ORDER, PERFORMED IN ~~LOC~~ HOSPITAL LAB): SARS Coronavirus 2: NEGATIVE

## 2018-08-20 MED ORDER — LACTATED RINGERS IV SOLN
500.0000 mL | INTRAVENOUS | Status: DC | PRN
  Administered 2018-08-21: 1000 mL via INTRAVENOUS

## 2018-08-20 MED ORDER — BUTORPHANOL TARTRATE 2 MG/ML IJ SOLN
1.0000 mg | INTRAMUSCULAR | Status: DC | PRN
  Administered 2018-08-21: 1 mg via INTRAVENOUS
  Filled 2018-08-20: qty 1

## 2018-08-20 MED ORDER — AMMONIA AROMATIC IN INHA
RESPIRATORY_TRACT | Status: AC
  Filled 2018-08-20: qty 10

## 2018-08-20 MED ORDER — LIDOCAINE HCL (PF) 1 % IJ SOLN
INTRAMUSCULAR | Status: AC
  Filled 2018-08-20: qty 30

## 2018-08-20 MED ORDER — OXYTOCIN 40 UNITS IN NORMAL SALINE INFUSION - SIMPLE MED
INTRAVENOUS | Status: AC
  Administered 2018-08-20: 2 m[IU]/min via INTRAVENOUS
  Filled 2018-08-20: qty 1000

## 2018-08-20 MED ORDER — ONDANSETRON HCL 4 MG/2ML IJ SOLN
4.0000 mg | Freq: Four times a day (QID) | INTRAMUSCULAR | Status: DC | PRN
  Administered 2018-08-21: 4 mg via INTRAVENOUS
  Filled 2018-08-20: qty 2

## 2018-08-20 MED ORDER — MISOPROSTOL 200 MCG PO TABS
ORAL_TABLET | ORAL | Status: AC
  Filled 2018-08-20: qty 4

## 2018-08-20 MED ORDER — TERBUTALINE SULFATE 1 MG/ML IJ SOLN: 0.2500 mg | Freq: Once | INTRAMUSCULAR | Status: DC | PRN

## 2018-08-20 MED ORDER — LACTATED RINGERS IV SOLN
INTRAVENOUS | Status: DC
  Administered 2018-08-20: 22:00:00 via INTRAVENOUS

## 2018-08-20 MED ORDER — ACETAMINOPHEN 500 MG PO TABS: 1000.0000 mg | ORAL_TABLET | Freq: Four times a day (QID) | ORAL | Status: DC | PRN

## 2018-08-20 MED ORDER — OXYTOCIN 40 UNITS IN NORMAL SALINE INFUSION - SIMPLE MED
2.5000 [IU]/h | INTRAVENOUS | Status: DC
  Administered 2018-08-21: 16:00:00 500 mL via INTRAVENOUS

## 2018-08-20 MED ORDER — OXYTOCIN BOLUS FROM INFUSION: 500.0000 mL | Freq: Once | INTRAVENOUS | Status: DC

## 2018-08-20 MED ORDER — OXYTOCIN 10 UNIT/ML IJ SOLN
INTRAMUSCULAR | Status: AC
  Filled 2018-08-20: qty 2

## 2018-08-20 MED ORDER — PENICILLIN G 3 MILLION UNITS IVPB - SIMPLE MED
3.0000 10*6.[IU] | INTRAVENOUS | Status: DC
  Administered 2018-08-21 (×3): 3 10*6.[IU] via INTRAVENOUS
  Filled 2018-08-20 (×8): qty 100
  Filled 2018-08-20: qty 3
  Filled 2018-08-20 (×3): qty 100

## 2018-08-20 MED ORDER — LIDOCAINE HCL (PF) 1 % IJ SOLN: 30.0000 mL | INTRAMUSCULAR | Status: DC | PRN

## 2018-08-20 MED ORDER — SOD CITRATE-CITRIC ACID 500-334 MG/5ML PO SOLN
30.0000 mL | ORAL | Status: DC | PRN
  Administered 2018-08-21: 30 mL via ORAL

## 2018-08-20 MED ORDER — OXYTOCIN 40 UNITS IN NORMAL SALINE INFUSION - SIMPLE MED
1.0000 m[IU]/min | INTRAVENOUS | Status: DC
  Administered 2018-08-20: 2 m[IU]/min via INTRAVENOUS
  Filled 2018-08-20: qty 1000

## 2018-08-20 MED ORDER — SODIUM CHLORIDE 0.9 % IV SOLN
5.0000 10*6.[IU] | Freq: Once | INTRAVENOUS | Status: AC
  Administered 2018-08-20: 5 10*6.[IU] via INTRAVENOUS
  Filled 2018-08-20: qty 5

## 2018-08-20 NOTE — H&P (Signed)
OB History & Physical   History of Present Illness:  Chief Complaint:   HPI:  Molly Morton is a 28 y.o. G1P0 female at [redacted]w[redacted]d dated by  Patient's last menstrual period was 11/25/2017. Estimated Date of Delivery: 09/01/18  She presents to L&D for preeclampsia evaluation due to elevated blood pressure in the office.  Patient called office today reporting not feeling right, had taken BP at home and was elevated twice over the weekend.  She has also had a persistent but intermittent headache during this time.  Mild in severity, and currently absent.  +FM, no CTX, no LOF, no VB Denies: visual changes, SOB, or RUQ/epigastric pain  Pregnancy Issues: 1. Anxiety 2. Obesity (BMI 38) 3. GBS+ 4. Hypertension on admission  Maternal Medical History:   Past Medical History:  Diagnosis Date  . Anxiety     Past Surgical History:  Procedure Laterality Date  . BREAST SURGERY    . WISDOM TOOTH EXTRACTION      Not on File  Prior to Admission medications   Medication Sig Start Date End Date Taking? Authorizing Provider  Prenatal Vit-Fe Fumarate-FA (PRENATAL MULTIVITAMIN) TABS tablet Take 1 tablet by mouth daily at 12 noon.   Yes [provider]  sertraline (ZOLOFT) 50 MG tablet Take 50 mg by mouth daily.   Yes [provider]     Prenatal care site: [facility]    Social History: She  reports that she has never smoked. She has never used smokeless tobacco. She reports that she does not drink alcohol or use drugs.  Family History: family history is not on file. lung cancer, diabetes, HTN, heart disease  Review of Systems: A full review of systems was performed and negative except as noted in the HPI.     Physical Exam:  Vital Signs: BP 138/83 (BP Location: Left Arm)   Pulse 70   Temp 98.7 F (37.1 C) (Oral)   Resp 18   Ht 5\' 6"  (1.676 m)   Wt 108.9 kg   LMP 11/25/2017   BMI 38.74 kg/m  General: no acute distress.  HEENT: normocephalic,  atraumatic Heart: regular rate & rhythm.  No murmurs/rubs/gallops Lungs: clear to auscultation bilaterally, normal respiratory effort Abdomen: soft, gravid, non-tender;  EFW: 8-2 Pelvic:   External: Normal external female genitalia  Cervix: Dilation: 3 / Effacement (%): 70 / Station: -2    Extremities: non-tender, symmetric, 1+ edema bilaterally.  DTRs: 2+ Neurologic: Alert & oriented x 3.    Results for orders placed or performed during the hospital encounter of 08/20/18 (from the past 24 hour(s))  Protein / creatinine ratio, urine     Status: None   Collection Time: 08/20/18  5:52 PM  Result Value Ref Range   Creatinine, Urine 119 mg/dL   Total Protein, Urine 10 mg/dL   Protein Creatinine Ratio 0.08 0.00 - 0.15 mg/mg[Cre]  CBC     Status: Abnormal   Collection Time: 08/20/18  6:04 PM  Result Value Ref Range   WBC 11.8 (H) 4.0 - 10.5 K/uL   RBC 3.89 3.87 - 5.11 MIL/uL   Hemoglobin 11.6 (L) 12.0 - 15.0 g/dL   HCT 56.1 (L) 53.7 - 94.3 %   MCV 87.7 80.0 - 100.0 fL   MCH 29.8 26.0 - 34.0 pg   MCHC 34.0 30.0 - 36.0 g/dL   RDW 27.6 14.7 - 09.2 %   Platelets 213 150 - 400 K/uL   nRBC 0.0 0.0 - 0.2 %  Comprehensive metabolic panel     Status: Abnormal   Collection Time: 08/20/18  6:04 PM  Result Value Ref Range   Sodium 134 (L) 135 - 145 mmol/L   Potassium 3.9 3.5 - 5.1 mmol/L   Chloride 105 98 - 111 mmol/L   CO2 20 (L) 22 - 32 mmol/L   Glucose, Bld 78 70 - 99 mg/dL   BUN 13 6 - 20 mg/dL   Creatinine, Ser 4.090.62 0.44 - 1.00 mg/dL   Calcium 8.3 (L) 8.9 - 10.3 mg/dL   Total Protein 6.0 (L) 6.5 - 8.1 g/dL   Albumin 3.0 (L) 3.5 - 5.0 g/dL   AST 19 15 - 41 U/L   ALT 15 0 - 44 U/L   Alkaline Phosphatase 117 38 - 126 U/L   Total Bilirubin 0.4 0.3 - 1.2 mg/dL   GFR calc non Af Amer >60 >60 mL/min   GFR calc Af Amer >60 >60 mL/min   Anion gap 9 5 - 15    Pertinent Results:  Prenatal Labs: Blood type/Rh O+  Antibody screen neg  Rubella Immune  Varicella Immune  RPR NR  HBsAg  Neg  HIV NR  GC neg  Chlamydia neg  Genetic screening declined  1 hour GTT 134  3 hour GTT --  GBS positive   FHT: 135 mod, with accelerations, no decelerations; 25 minute period of saw-tooth pattern with spontaneous recovery. TOCO: none SVE:  Dilation: 3 / Effacement (%): 70 / Station: -2    Cephalic by leopolds   Assessment:  Molly Morton is a 28 y.o. G1P0 female at 6072w2d with IOL for GHTN and category 2 fetal tracing.   Plan:  1. Admit to Labor & Delivery 2. CBC, T&S, IVF 3. GBS positive - start PCN   4. Consents obtained. 5. Continuous efm/toco 6. IUP: category 2 with saw-tooth pattern, currently resolved. 7. GHTN: no s/sx preeclampsia - HA has improved spontaneously 8. IOL: pitocin due to fetal strip.  ----- Ranae Plumberhelsea Ward, MD Attending Obstetrician and Gynecologist [facility], Department of OB/GYN [facility]

## 2018-08-20 NOTE — OB Triage Note (Signed)
Pt G1P0 [redacted]w[redacted]d comes to L&D for PIH workup. Pt denies h/a, blurry vision, seeing spots, RUQ pain, +1 reflexes, and absent clonus. Pt denies ctx, LOF, and bleeding and reports good fetal movement.VSS. Monitors applied and assessing.

## 2018-08-21 ENCOUNTER — Encounter
Admission: EM | Disposition: A | Discharge status: Home / Self Care | Payer: Self-pay | Source: Home / Self Care | Attending: Obstetrics and Gynecology

## 2018-08-21 ENCOUNTER — Inpatient Hospital Stay: Payer: 59 | Admitting: Anesthesiology

## 2018-08-21 ENCOUNTER — Encounter: Payer: Self-pay | Admitting: Anesthesiology

## 2018-08-21 DIAGNOSIS — Z9889 Other specified postprocedural states: Secondary | ICD-10-CM

## 2018-08-21 SURGERY — Surgical Case
Anesthesia: Epidural

## 2018-08-21 MED ORDER — TETANUS-DIPHTH-ACELL PERTUSSIS 5-2.5-18.5 LF-MCG/0.5 IM SUSP
0.5000 mL | Freq: Once | INTRAMUSCULAR | Status: DC
  Filled 2018-08-21 (×2): qty 0.5

## 2018-08-21 MED ORDER — SERTRALINE HCL 25 MG PO TABS
50.0000 mg | ORAL_TABLET | Freq: Every day | ORAL | Status: DC
  Administered 2018-08-22 – 2018-08-24 (×3): 50 mg via ORAL
  Filled 2018-08-21: qty 1
  Filled 2018-08-21 (×3): qty 2

## 2018-08-21 MED ORDER — OXYCODONE HCL 5 MG PO TABS
5.0000 mg | ORAL_TABLET | ORAL | Status: DC | PRN
  Administered 2018-08-22 – 2018-08-23 (×5): 5 mg via ORAL
  Administered 2018-08-23: 10 mg via ORAL
  Administered 2018-08-23 – 2018-08-24 (×5): 5 mg via ORAL
  Filled 2018-08-21 (×3): qty 1
  Filled 2018-08-21 (×2): qty 2
  Filled 2018-08-21 (×3): qty 1
  Filled 2018-08-21: qty 2
  Filled 2018-08-21 (×3): qty 1

## 2018-08-21 MED ORDER — BUPIVACAINE LIPOSOME 1.3 % IJ SUSP
INTRAMUSCULAR | Status: AC
  Filled 2018-08-21: qty 20

## 2018-08-21 MED ORDER — KETAMINE HCL 50 MG/ML IJ SOLN
INTRAMUSCULAR | Status: AC
  Filled 2018-08-21: qty 10

## 2018-08-21 MED ORDER — PHENYLEPHRINE HCL (PRESSORS) 10 MG/ML IV SOLN
INTRAVENOUS | Status: AC
  Filled 2018-08-21: qty 1

## 2018-08-21 MED ORDER — SIMETHICONE 80 MG PO CHEW
80.0000 mg | CHEWABLE_TABLET | Freq: Three times a day (TID) | ORAL | Status: DC
  Administered 2018-08-22 – 2018-08-24 (×9): 80 mg via ORAL
  Filled 2018-08-21 (×9): qty 1

## 2018-08-21 MED ORDER — CARBOPROST TROMETHAMINE 250 MCG/ML IM SOLN
INTRAMUSCULAR | Status: AC
  Filled 2018-08-21: qty 1

## 2018-08-21 MED ORDER — NALBUPHINE HCL 10 MG/ML IJ SOLN
INTRAMUSCULAR | Status: AC
  Filled 2018-08-21: qty 1

## 2018-08-21 MED ORDER — FENTANYL CITRATE (PF) 100 MCG/2ML IJ SOLN
INTRAMUSCULAR | Status: AC
  Filled 2018-08-21: qty 2

## 2018-08-21 MED ORDER — SODIUM CHLORIDE 0.9 % IV SOLN
INTRAVENOUS | Status: DC | PRN
  Administered 2018-08-21: 65 mL

## 2018-08-21 MED ORDER — FENTANYL 2.5 MCG/ML W/ROPIVACAINE 0.15% IN NS 100 ML EPIDURAL (ARMC)
12.0000 mL/h | EPIDURAL | Status: DC
  Filled 2018-08-21: qty 100

## 2018-08-21 MED ORDER — DEXTROSE 5 % IV SOLN
3.0000 g | Freq: Once | INTRAVENOUS | Status: AC
  Administered 2018-08-21: 3 g via INTRAVENOUS
  Filled 2018-08-21: qty 3

## 2018-08-21 MED ORDER — LACTATED RINGERS IV SOLN: INTRAVENOUS | Status: DC

## 2018-08-21 MED ORDER — BUPIVACAINE LIPOSOME 1.3 % IJ SUSP: 20.0000 mL | Freq: Once | INTRAMUSCULAR | Status: DC

## 2018-08-21 MED ORDER — MIDAZOLAM HCL 2 MG/2ML IJ SOLN
INTRAMUSCULAR | Status: DC | PRN
  Administered 2018-08-21: 1 mg via INTRAVENOUS

## 2018-08-21 MED ORDER — BUPIVACAINE HCL (PF) 0.25 % IJ SOLN
INTRAMUSCULAR | Status: DC | PRN
  Administered 2018-08-21: 3 mL via EPIDURAL
  Administered 2018-08-21: 5 mL via EPIDURAL

## 2018-08-21 MED ORDER — ENOXAPARIN SODIUM 40 MG/0.4ML ~~LOC~~ SOLN
40.0000 mg | SUBCUTANEOUS | Status: DC
  Administered 2018-08-22 – 2018-08-24 (×3): 40 mg via SUBCUTANEOUS
  Filled 2018-08-21 (×3): qty 0.4

## 2018-08-21 MED ORDER — EPHEDRINE 5 MG/ML INJ
10.0000 mg | INTRAVENOUS | Status: AC | PRN
  Administered 2018-08-21 (×2): 10 mg via INTRAVENOUS
  Filled 2018-08-21: qty 4

## 2018-08-21 MED ORDER — MORPHINE SULFATE (PF) 2 MG/ML IV SOLN: 1.0000 mg | INTRAVENOUS | Status: DC | PRN

## 2018-08-21 MED ORDER — LACTATED RINGERS IV SOLN
500.0000 mL | Freq: Once | INTRAVENOUS | Status: AC
  Administered 2018-08-21: 500 mL via INTRAVENOUS

## 2018-08-21 MED ORDER — SIMETHICONE 80 MG PO CHEW: 80.0000 mg | CHEWABLE_TABLET | ORAL | Status: DC | PRN

## 2018-08-21 MED ORDER — DIPHENHYDRAMINE HCL 25 MG PO CAPS: 25.0000 mg | ORAL_CAPSULE | Freq: Four times a day (QID) | ORAL | Status: DC | PRN

## 2018-08-21 MED ORDER — COCONUT OIL OIL
1.0000 "application " | TOPICAL_OIL | Status: DC | PRN
  Filled 2018-08-21: qty 120

## 2018-08-21 MED ORDER — METHYLERGONOVINE MALEATE 0.2 MG/ML IJ SOLN
INTRAMUSCULAR | Status: AC
  Filled 2018-08-21: qty 1

## 2018-08-21 MED ORDER — GABAPENTIN 300 MG PO CAPS
300.0000 mg | ORAL_CAPSULE | Freq: Two times a day (BID) | ORAL | Status: DC
  Administered 2018-08-21 – 2018-08-24 (×6): 300 mg via ORAL
  Filled 2018-08-21 (×6): qty 1

## 2018-08-21 MED ORDER — BUPIVACAINE HCL (PF) 0.5 % IJ SOLN
INTRAMUSCULAR | Status: DC | PRN
  Administered 2018-08-21: 25 mL

## 2018-08-21 MED ORDER — SOD CITRATE-CITRIC ACID 500-334 MG/5ML PO SOLN
ORAL | Status: AC
  Filled 2018-08-21: qty 30

## 2018-08-21 MED ORDER — KETOROLAC TROMETHAMINE 30 MG/ML IJ SOLN
INTRAMUSCULAR | Status: DC | PRN
  Administered 2018-08-21: 30 mg via INTRAVENOUS

## 2018-08-21 MED ORDER — FENTANYL CITRATE (PF) 100 MCG/2ML IJ SOLN
INTRAMUSCULAR | Status: DC | PRN
  Administered 2018-08-21 (×2): 50 ug via INTRAVENOUS

## 2018-08-21 MED ORDER — WITCH HAZEL-GLYCERIN EX PADS: 1.0000 "application " | MEDICATED_PAD | CUTANEOUS | Status: DC | PRN

## 2018-08-21 MED ORDER — PHENYLEPHRINE 40 MCG/ML (10ML) SYRINGE FOR IV PUSH (FOR BLOOD PRESSURE SUPPORT): 80.0000 ug | PREFILLED_SYRINGE | INTRAVENOUS | Status: DC | PRN

## 2018-08-21 MED ORDER — DIPHENHYDRAMINE HCL 50 MG/ML IJ SOLN: 12.5000 mg | INTRAMUSCULAR | Status: DC | PRN

## 2018-08-21 MED ORDER — SIMETHICONE 80 MG PO CHEW: 80.0000 mg | CHEWABLE_TABLET | ORAL | Status: DC

## 2018-08-21 MED ORDER — ONDANSETRON HCL 4 MG/2ML IJ SOLN
INTRAMUSCULAR | Status: DC | PRN
  Administered 2018-08-21: 4 mg via INTRAVENOUS

## 2018-08-21 MED ORDER — PRENATAL MULTIVITAMIN CH
1.0000 | ORAL_TABLET | Freq: Every day | ORAL | Status: DC
  Administered 2018-08-22 – 2018-08-24 (×3): 1 via ORAL
  Filled 2018-08-21 (×3): qty 1

## 2018-08-21 MED ORDER — NALBUPHINE HCL 10 MG/ML IJ SOLN
2.5000 mg | Freq: Once | INTRAMUSCULAR | Status: AC | PRN
  Administered 2018-08-21: 2.5 mg via INTRAVENOUS

## 2018-08-21 MED ORDER — SENNOSIDES-DOCUSATE SODIUM 8.6-50 MG PO TABS
2.0000 | ORAL_TABLET | ORAL | Status: DC
  Administered 2018-08-22 – 2018-08-24 (×3): 2 via ORAL
  Filled 2018-08-21 (×3): qty 2

## 2018-08-21 MED ORDER — SODIUM CHLORIDE (PF) 0.9 % IJ SOLN
INTRAMUSCULAR | Status: AC
  Filled 2018-08-21: qty 50

## 2018-08-21 MED ORDER — FENTANYL-BUPIVACAINE-NACL 0.5-0.125-0.9 MG/250ML-% EP SOLN: 12.0000 mL/h | EPIDURAL | Status: DC | PRN

## 2018-08-21 MED ORDER — FENTANYL 2.5 MCG/ML W/ROPIVACAINE 0.15% IN NS 100 ML EPIDURAL (ARMC)
EPIDURAL | Status: AC
  Filled 2018-08-21: qty 100

## 2018-08-21 MED ORDER — MIDAZOLAM HCL 2 MG/2ML IJ SOLN
INTRAMUSCULAR | Status: AC
  Filled 2018-08-21: qty 2

## 2018-08-21 MED ORDER — OXYTOCIN 40 UNITS IN NORMAL SALINE INFUSION - SIMPLE MED
2.5000 [IU]/h | INTRAVENOUS | Status: AC
  Administered 2018-08-21: 2.5 [IU]/h via INTRAVENOUS
  Filled 2018-08-21: qty 1000

## 2018-08-21 MED ORDER — BUPIVACAINE HCL (PF) 0.5 % IJ SOLN
INTRAMUSCULAR | Status: AC
  Filled 2018-08-21: qty 30

## 2018-08-21 MED ORDER — IBUPROFEN 800 MG PO TABS
800.0000 mg | ORAL_TABLET | Freq: Four times a day (QID) | ORAL | Status: DC
  Administered 2018-08-22 – 2018-08-24 (×8): 800 mg via ORAL
  Filled 2018-08-21 (×8): qty 1

## 2018-08-21 MED ORDER — DIBUCAINE (PERIANAL) 1 % EX OINT: 1.0000 "application " | TOPICAL_OINTMENT | CUTANEOUS | Status: DC | PRN

## 2018-08-21 MED ORDER — FENTANYL 2.5 MCG/ML W/ROPIVACAINE 0.15% IN NS 100 ML EPIDURAL (ARMC)
EPIDURAL | Status: DC | PRN
  Administered 2018-08-21: 12 mL/h via EPIDURAL

## 2018-08-21 MED ORDER — LIDOCAINE HCL (PF) 1 % IJ SOLN
INTRAMUSCULAR | Status: DC | PRN
  Administered 2018-08-21: 4 mL via INTRADERMAL

## 2018-08-21 MED ORDER — MENTHOL 3 MG MT LOZG
1.0000 | LOZENGE | OROMUCOSAL | Status: DC | PRN
  Filled 2018-08-21: qty 9

## 2018-08-21 MED ORDER — ONDANSETRON HCL 4 MG/2ML IJ SOLN: 4.0000 mg | Freq: Once | INTRAMUSCULAR | Status: DC | PRN

## 2018-08-21 MED ORDER — KETOROLAC TROMETHAMINE 30 MG/ML IJ SOLN
30.0000 mg | Freq: Four times a day (QID) | INTRAMUSCULAR | Status: AC
  Administered 2018-08-21 – 2018-08-22 (×3): 30 mg via INTRAVENOUS
  Filled 2018-08-21 (×3): qty 1

## 2018-08-21 MED ORDER — ZOLPIDEM TARTRATE 5 MG PO TABS: 5.0000 mg | ORAL_TABLET | Freq: Every evening | ORAL | Status: DC | PRN

## 2018-08-21 MED ORDER — FENTANYL CITRATE (PF) 100 MCG/2ML IJ SOLN: 25.0000 ug | INTRAMUSCULAR | Status: DC | PRN

## 2018-08-21 MED ORDER — LIDOCAINE-EPINEPHRINE (PF) 1.5 %-1:200000 IJ SOLN
INTRAMUSCULAR | Status: DC | PRN
  Administered 2018-08-21: 4 mL via PERINEURAL

## 2018-08-21 MED ORDER — LIDOCAINE 2% (20 MG/ML) 5 ML SYRINGE
INTRAMUSCULAR | Status: DC | PRN
  Administered 2018-08-21: 100 mg via INTRAVENOUS

## 2018-08-21 MED ORDER — EPHEDRINE 5 MG/ML INJ: 10.0000 mg | INTRAVENOUS | Status: DC | PRN

## 2018-08-21 MED ORDER — KETAMINE HCL 50 MG/ML IJ SOLN
INTRAMUSCULAR | Status: DC | PRN
  Administered 2018-08-21 (×2): 25 mg via INTRAMUSCULAR

## 2018-08-21 SURGICAL SUPPLY — 24 items
BARRIER ADHS 3X4 INTERCEED (GAUZE/BANDAGES/DRESSINGS) ×2 IMPLANT
CANISTER SUCT 3000ML PPV (MISCELLANEOUS) ×2 IMPLANT
CHLORAPREP W/TINT 26 (MISCELLANEOUS) ×2 IMPLANT
COVER WAND RF STERILE (DRAPES) ×2 IMPLANT
DRSG TELFA 3X8 NADH (GAUZE/BANDAGES/DRESSINGS) ×2 IMPLANT
ELECT CAUTERY BLADE 6.4 (BLADE) IMPLANT
ELECT REM PT RETURN 9FT ADLT (ELECTROSURGICAL) ×2
ELECTRODE REM PT RTRN 9FT ADLT (ELECTROSURGICAL) ×1 IMPLANT
GAUZE SPONGE 4X4 12PLY STRL (GAUZE/BANDAGES/DRESSINGS) ×2 IMPLANT
GLOVE BIO SURGEON STRL SZ8 (GLOVE) ×2 IMPLANT
GOWN STRL REUS W/ TWL LRG LVL3 (GOWN DISPOSABLE) ×2 IMPLANT
GOWN STRL REUS W/ TWL XL LVL3 (GOWN DISPOSABLE) ×1 IMPLANT
GOWN STRL REUS W/TWL LRG LVL3 (GOWN DISPOSABLE) ×2
GOWN STRL REUS W/TWL XL LVL3 (GOWN DISPOSABLE) ×1
NS IRRIG 1000ML POUR BTL (IV SOLUTION) ×2 IMPLANT
PACK C SECTION AR (MISCELLANEOUS) ×2 IMPLANT
PAD OB MATERNITY 4.3X12.25 (PERSONAL CARE ITEMS) ×2 IMPLANT
PAD PREP 24X41 OB/GYN DISP (PERSONAL CARE ITEMS) ×2 IMPLANT
PENCIL SMOKE ULTRAEVAC 22 CON (MISCELLANEOUS) ×2 IMPLANT
STAPLER INSORB 30 2030 C-SECTI (MISCELLANEOUS) ×2 IMPLANT
STRAP SAFETY 5IN WIDE (MISCELLANEOUS) ×2 IMPLANT
SUT CHROMIC 1 CTX 36 (SUTURE) ×6 IMPLANT
SUT PLAIN GUT 0 (SUTURE) ×4 IMPLANT
SUT VIC AB 0 CT1 36 (SUTURE) ×4 IMPLANT

## 2018-08-21 NOTE — Anesthesia Preprocedure Evaluation (Addendum)
Anesthesia Evaluation  Patient identified by MRN, date of birth, ID band Patient awake    Reviewed: Allergy & Precautions, H&P , NPO status , Patient's Chart, lab work & pertinent test results, reviewed documented beta blocker date and time   Airway Mallampati: II  TM Distance: >3 FB Neck ROM: full    Dental no notable dental hx. (+) Teeth Intact   Pulmonary neg pulmonary ROS, Current Smoker,    Pulmonary exam normal breath sounds clear to auscultation       Cardiovascular Exercise Tolerance: Good negative cardio ROS   Rhythm:regular Rate:Normal     Neuro/Psych Anxiety LBP status post recent epidural steroid with DDD negative neurological ROS  negative psych ROS   GI/Hepatic negative GI ROS, Neg liver ROS,   Endo/Other  negative endocrine ROSdiabetes  Renal/GU      Musculoskeletal   Abdominal   Peds  Hematology negative hematology ROS (+)   Anesthesia Other Findings   Reproductive/Obstetrics (+) Pregnancy                            Anesthesia Physical Anesthesia Plan  ASA: II  Anesthesia Plan: Epidural   Post-op Pain Management:    Induction: Intravenous  PONV Risk Score and Plan:   Airway Management Planned:   Additional Equipment:   Intra-op Plan:   Post-operative Plan:   Informed Consent: I have reviewed the patients History and Physical, chart, labs and discussed the procedure including the risks, benefits and alternatives for the proposed anesthesia with the patient or authorized representative who has indicated his/her understanding and acceptance.       Plan Discussed with: CRNA  Anesthesia Plan Comments:        Anesthesia Quick Evaluation

## 2018-08-21 NOTE — Discharge Summary (Signed)
Obstetrical Discharge Summary  Patient Name: Molly Morton DOB: 08/11/1990 MRN: [id number]  Date of Admission: 08/20/2018 Date of Delivery: 08/21/2018 Delivered by: Beverly Gust Schermerhorn MD Date of Discharge: 08/24/2018 [facility] NWG:[id number]:Patient's last menstrual period was 11/25/2017. EDC Estimated Date of Delivery: 09/01/18 Gestational Age at Delivery: 6017w3d   Antepartum complications: 1. Anxiety 2. Obesity (BMI 38) 3. GBS+ 4. Hypertension on admission Admitting Diagnosis:  Secondary Diagnosis: Patient Active Problem List   Diagnosis Date Noted  . Post-operative state 08/21/2018  . Labor and delivery indication for care or intervention 08/20/2018    Augmentation: Pitocin Complications: None Intrapartum complications/course: progresses to 7 cm and did not progress past .  Date of Delivery: 08/24/2018  Delivered By: Beverly Gust. Schermerhorn MD Delivery Type: primary cesarean section, low transverse incision Anesthesia: epidural Placenta: spontaneous Laceration:  Episiotomy: none Newborn Data: This patient has no babies on file.   Postpartum Procedures: none  Post partum course: Cesarean Section:   Patient had an uncomplicated postpartum course.  By time of discharge on POD#3, her pain was controlled on oral pain medications; she had appropriate lochia and was ambulating, voiding without difficulty, tolerating regular diet and passing flatus.   She was deemed stable for discharge to home.    Discharge Physical Exam:  BP 120/74 (BP Location: Right Arm)   Pulse 73   Temp 97.8 F (36.6 C) (Axillary)   Resp 18   Ht 5\' 6"  (1.676 m)   Wt 108.9 kg   LMP 11/25/2017   SpO2 100% Comment: Room Air  Breastfeeding Unknown   BMI 38.74 kg/m   General: NAD CV: RRR Pulm: CTABL, nl effort ABD: s/nd/nt, fundus firm and below the umbilicus Lochia: moderate Incision: c/d/i DVT Evaluation: LE non-ttp, no evidence of DVT on exam.  Hemoglobin  Date Value Ref Range Status   08/22/2018 9.3 (L) 12.0 - 15.0 g/dL Final   HCT  Date Value Ref Range Status  08/22/2018 27.8 (L) 36.0 - 46.0 % Final     Disposition: stable, discharge to home. Baby Feeding: breastmilk Baby Disposition: home with mom  Rh Immune globulin given: n/a Rubella vaccine given: n/a Tdap vaccine given in AP or PP setting: AP Flu vaccine given in AP or PP setting: AP  Contraception: POPs  Prenatal Labs:  Blood type/Rh O+  Antibody screen neg  Rubella Immune  Varicella Immune  RPR NR  HBsAg Neg  HIV NR  GC neg  Chlamydia neg  Genetic screening declined  1 hour GTT 134  3 hour GTT --  GBS positive       Plan:  Molly Morton was discharged to home in good condition. Follow-up appointment with delivering provider in 2 weeks.  Discharge Medications: Allergies as of 08/24/2018   No Known Allergies     Medication List    TAKE these medications   ibuprofen 800 MG tablet Commonly known as:  ADVIL Take 1 tablet (800 mg total) by mouth every 6 (six) hours.   norethindrone 0.35 MG tablet Commonly known as:  Ortho Micronor Take 1 tablet (0.35 mg total) by mouth daily. Start taking on:  September 21, 2018   oxyCODONE 5 MG immediate release tablet Commonly known as:  Oxy IR/ROXICODONE Take 1 tablet (5 mg total) by mouth every 4 (four) hours as needed for moderate pain.   prenatal multivitamin Tabs tablet Take 1 tablet by mouth daily at 12 noon.   senna-docusate 8.6-50 MG tablet Commonly known as:  Senokot-S Take 2 tablets  by mouth daily. Start taking on:  August 25, 2018   sertraline 50 MG tablet Commonly known as:  ZOLOFT Take 50 mg by mouth daily.   simethicone 80 MG chewable tablet Commonly known as:  MYLICON Chew 1 tablet (80 mg total) by mouth 3 (three) times daily after meals.       Follow-up Information    Schermerhorn, Ihor Austin, MD Follow up in 2 week(s).   Specialty:  Obstetrics and Gynecology Why:  video visit  Contact information: [address] [place] [address] [phone]           Signed: Randa Ngo, CNM 08/24/2018 12:41 PM

## 2018-08-21 NOTE — Progress Notes (Signed)
Patient up to chair/ walking in room

## 2018-08-21 NOTE — Op Note (Signed)
NAME: Molly Morton, Brexley M. MEDICAL RECORD ZO:[id number] ACCOUNT [id number] DATE OF BIRTH:1991-01-26 FACILITY: [facility] LOCATION: [facility] PHYSICIAN:Masin Shatto Cloyde ReamsJ. Cayci Mcnabb, MD  OPERATIVE REPORT  DATE OF PROCEDURE:  08/21/2018  PREOPERATIVE DIAGNOSES: 1.  38 weeks estimated gestational age. 2.  Active phase arrest. 3.  Cephalopelvic disproportion.  POSTOPERATIVE DIAGNOSES: 1.  38 weeks estimated gestational age. 2.  Active phase arrest. 3.  Cephalopelvic disproportion.  PROCEDURE:  Primary low transverse cesarean section.  SURGEON:  Suzy Bouchardhomas J. Nakeysha Pasqual, MD  FIRST ASSISTANT:  Scrub tech.  ANESTHESIA:  Surgical dosing of continuous lumbar epidural.  INDICATIONS:  A 28 year old gravida 1, para 0 patient admitted to labor and delivery with elevated blood pressure and induction was started.  The patient progressed to 6-7 cm throughout the day on 08/21/2018 and did not progress past that.  Examination  revealed a narrow pubic arch.  The patient did have an intrauterine pressure catheter with adequate uterine contractions and did not move her cervix after 8 hours.  DESCRIPTION OF PROCEDURE:  After adequate surgical dosing of continuous lumbar epidural, the patient was placed in dorsal supine position.  The patient's abdomen was prepped and draped in normal sterile fashion.  The patient did receive 3 grams IV Ancef  for surgical prophylaxis.  Timeout was performed.  A Pfannenstiel incision was made 2 fingerbreadths above the symphysis pubis.  Sharp dissection was used to identify the fascia.  Fascia was opened in the midline and opened in a transverse fashion.  The  superior aspect of the fascia was grasped with Kocher clamps and recti muscles were dissected free.  Inferior aspect of the fascia was grasped with Kocher clamps and pyramidalis muscles dissected free.  Entry into the peritoneal cavity was accomplished  sharply.  The vesicouterine peritoneal fold was identified and a bladder flap  was created and the bladder was reflected inferiorly.  Low transverse uterine incision was made.  Upon entry into the endometrial cavity, clear fluid resulted.  The incision  was extended with blunt transverse traction.  Fetal head was brought to the incision.  It was noted that the head was extremely wedged against the pubis.  Head was delivered to the incision and a Kiwi bell vacuum was applied to the occiput and with 1  pull the head was delivered and the vacuum was removed.  Shoulders and body were delivered without difficulty.  A vigorous female was placed on the mother's abdomen and upper thighs and was dried and suctioned.  After 60 seconds, cord was doubly clamped  and a female was passed to nursery staff who assigned Apgar scores of 9 and 9.  Cord blood was obtained and the placenta was manually removed followed by exteriorization of the uterus.  The endometrial cavity was wiped clean with laparotomy tape.   Uterine incision was closed with #1 chromic suture in a running locking fashion.  Good approximation of edges.  Good hemostasis was noted.  Fallopian tubes and ovaries appeared normal.  Posterior cul-de-sac was irrigated and suctioned and the uterus was  placed back into the abdominal cavity.  The pericolic gutters were then wiped clean with laparotomy tape.  Uterine incision again appeared hemostatic and Interceed was placed over the incision site.  Fascia was then closed with 0 Vicryl suture in a  running nonlocking fashion.  Good approximation of edges.  Two separate sutures were used.  Fascial edges were then injected with a solution of 20 mL of 1.3% Exparel plus 30 mL of 0.5% Marcaine plus 50 mL normal  saline; 60 mL of this solution was  injected in the fascial edges.  Subcutaneous tissues were irrigated and bovied for hemostasis given the depth of the subcutaneous tissue approximately 5 cm.  Dead space was closed with a running 2-0 chromic suture.  Skin was reapproximated with Insorb   absorbable staples.  Good cosmetic effect.  The patient tolerated the procedure well.  INTRAOPERATIVE FLUIDS:  700 mL  ESTIMATED BLOOD LOSS:  315 mL  URINE OUTPUT:  150 mL   At the end of the case, an additional 30 mL of Exparel solution was injected in the skin.  DISPOSITION:  The patient tolerated the procedure well and was taken to recovery room in good condition.  TN/NUANCE  D:08/21/2018 T:08/21/2018 JOB:[id number]

## 2018-08-21 NOTE — Progress Notes (Signed)
Molly Morton is a 28 y.o. G1P0 at [redacted]w[redacted]d  Subjective: Pitocin 31 mu/min  Objective: BP 121/70 (BP Location: Left Arm)   Pulse 81   Temp 98.5 F (36.9 C) (Oral)   Resp 16   Ht 5\' 6"  (1.676 m)   Wt 108.9 kg   LMP 11/25/2017   SpO2 98%   BMI 38.74 kg/m  No intake/output data recorded. Total I/O In: 2305.2 [I.V.:2305.2] Out: -   FHT:  FHR: 150 bpm, variability: moderate,  accelerations:  Present,  decelerations:  Absent UC:   regular, every 2-3 minutes SVE:   cx 7/90/+1   By Felicita Gage Narrow arch noted Labs: Lab Results  Component Value Date   WBC 11.8 (H) 08/20/2018   HGB 11.6 (L) 08/20/2018   HCT 34.1 (L) 08/20/2018   MCV 87.7 08/20/2018   PLT 213 08/20/2018    Assessment / Plan: Protracted labor +1 station , narrow arch  Continue labor re check in 1-2  and if no change LTCS recommended I have counseled the pt about the possibility of cesarean section  Ihor Austin Schermerhorn 08/21/2018, 1:52 PM

## 2018-08-21 NOTE — Progress Notes (Signed)
Molly Morton is a 28 y.o. G1P0 at [redacted]w[redacted]d by Subjective: Pit at 31 mu/ min  Comfortable   Objective: BP 120/62   Pulse 79   Temp 98.1 F (36.7 C) (Oral)   Resp 16   Ht 5\' 6"  (1.676 m)   Wt 108.9 kg   LMP 11/25/2017   SpO2 97%   BMI 38.74 kg/m  No intake/output data recorded. Total I/O In: 2305.2 [I.V.:2305.2] Out: -   FHT:  FHR: 140  bpm, variability: moderate,  accelerations:  Present,  decelerations:  Absent UC:   regular, every 2-3 minutes SVE:   Dilation: 7 Effacement (%): 90 Station: 0 Exam by:: [employer]: Lab Results  Component Value Date   WBC 11.8 (H) 08/20/2018   HGB 11.6 (L) 08/20/2018   HCT 34.1 (L) 08/20/2018   MCV 87.7 08/20/2018   PLT 213 08/20/2018    Assessment / Plan:  protracted labor . Adequate CTX pattern  Cont monitoring   Ihor Austin Ebonee Stober 08/21/2018, 12:20 PM

## 2018-08-21 NOTE — Transfer of Care (Signed)
Immediate Anesthesia Transfer of Care Note  Patient: Molly Morton  Procedure(s) Performed: CESAREAN SECTION (N/A )  Patient Location: PACU  Anesthesia Type:Epidural  Level of Consciousness: awake, alert  and oriented  Airway & Oxygen Therapy: Patient Spontanous Breathing  Post-op Assessment: Report given to RN and Post -op Vital signs reviewed and stable  Post vital signs: Reviewed and stable  Last Vitals:  Vitals Value Taken Time  BP 124/86 08/21/2018  4:45 PM  Temp 37 C 08/21/2018  4:45 PM  Pulse    Resp 18 08/21/2018  4:45 PM  SpO2 97 % 08/21/2018  4:45 PM    Last Pain:  Vitals:   08/21/18 1645  TempSrc: Oral  PainSc:          Complications: No apparent anesthesia complications

## 2018-08-21 NOTE — Brief Op Note (Signed)
08/20/2018 - 08/21/2018  4:33 PM  PATIENT:  Molly Morton  28 y.o. female  PRE-OPERATIVE DIAGNOSIS: active phase arrest Narrow pubic arch  POST-OPERATIVE DIAGNOSIS: SAA, CPD  PROCEDURE:  Procedure(s): CESAREAN SECTION (N/A) LTCS SURGEON:  Surgeon(s) and Role:    * Taci Sterling, Ihor Austin, MD - Primary  PHYSICIAN ASSISTANT: scrub tech   ASSISTANTS: none   ANESTHESIA:   epidural, surgical block  EBL:  315 cc, IOF 700 , uo 150  BLOOD ADMINISTERED:none  DRAINS: Urinary Catheter (Foley)   LOCAL MEDICATIONS USED:  MARCAINE    and BUPIVICAINE   SPECIMEN:  No Specimen  DISPOSITION OF SPECIMEN:  N/A  COUNTS:  YES  TOURNIQUET:  * No tourniquets in log *  DICTATION: .Other Dictation: Dictation Number verbal  PLAN OF CARE: Admit to inpatient   PATIENT DISPOSITION:  PACU - hemodynamically stable.   Delay start of Pharmacological VTE agent (>24hrs) due to surgical blood loss or risk of bleeding: not applicable

## 2018-08-21 NOTE — Progress Notes (Signed)
Molly Morton is a 28 y.o. G1P0 at [redacted]w[redacted]d  cx check   Objective: BP 124/63   Pulse 82   Temp 98.5 F (36.9 C) (Oral)   Resp 16   Ht 5\' 6"  (1.676 m)   Wt 108.9 kg   LMP 11/25/2017   SpO2 99%   BMI 38.74 kg/m  No intake/output data recorded. Total I/O In: 2305.2 [I.V.:2305.2] Out: -   FHT:  FHR: 140 bpm, variability: moderate,  accelerations:  Present,  decelerations:  Absent UC:   regular, every 3 minutes SVE:   Dilation: 7 Effacement (%): 90 Station: 0, Plus 1 Exam by:: Kadrian Partch MD  Labs: Lab Results  Component Value Date   WBC 11.8 (H) 08/20/2018   HGB 11.6 (L) 08/20/2018   HCT 34.1 (L) 08/20/2018   MCV 87.7 08/20/2018   PLT 213 08/20/2018    Assessment / Plan:   No cervical change for past 8 hours despite  Adequate ctx pattern  Narrow pubic arch ,  Proceed with LTCS . I have counseled the pt, risks reviewed .  All questions answered .   Ihor Austin Ahmari Garton 08/21/2018, 3:06 PM

## 2018-08-21 NOTE — Anesthesia Post-op Follow-up Note (Signed)
Anesthesia QCDR form completed.        

## 2018-08-21 NOTE — Progress Notes (Addendum)
Intrapartum progress note:   S: comfortable after epidural.  No complaints.   Denies: HA, visual changes, SOB, or RUQ/epigastric pain  Had lower BPs after epidural with nausea/vomiting and high epidural level.  Treated with position changes, ephedrine, bolus, and epidural stopped for 1 hour.  O: BP 133/80   Pulse (!) 137   Temp 98.3 F (36.8 C) (Oral)   Resp 18   Ht 5\' 6"  (1.676 m)   Wt 108.9 kg   LMP 11/25/2017   SpO2 100%   BMI 38.74 kg/m    FHT: 125 mod + accels no decels TOCO: tracing poorly, IUPC placed for monitoring purposes, Q3-4 min, pit @ 7 SVE: 6cm, 70%, -1,  SROM @ 01:30, clear fluid  A/P: 27yo G1P0 @ 38+3 with IOL for GHTN at term  1. IUP: category 1 tracing 2: IOL: continue pitocin 3. GHTN: no s/sx severe features.   4. Continue active management, anticipate vaginal delivery  ----- Ranae Plumber, MD, FACOG Attending Obstetrician and Gynecologist [facility], Department of OB/GYN [facility]

## 2018-08-21 NOTE — Anesthesia Procedure Notes (Signed)
Epidural Patient location during procedure: OB  Staffing Performed: anesthesiologist   Preanesthetic Checklist Completed: patient identified, site marked, surgical consent, pre-op evaluation, timeout performed, IV checked, risks and benefits discussed and monitors and equipment checked  Epidural Patient position: sitting Prep: Betadine Patient monitoring: heart rate, continuous pulse ox and blood pressure Approach: midline Location: L4-L5 Injection technique: LOR saline  Needle:  Needle type: Tuohy  Needle gauge: 17 G Needle length: 9 cm and 9 Needle insertion depth: 8 cm Catheter type: closed end flexible Catheter size: 19 Gauge Catheter at skin depth: 13 cm Test dose: negative and 1.5% lidocaine with Epi 1:200 K  Assessment Sensory level: T10 Events: blood not aspirated, injection not painful, no injection resistance, negative IV test and no paresthesia  Additional Notes   Patient tolerated the insertion well without complications.-SATD -IVTD. No paresthesia. Refer to [facility] nursing for VS and dosingReason for block:procedure for pain

## 2018-08-22 ENCOUNTER — Encounter: Payer: Self-pay | Admitting: Obstetrics and Gynecology

## 2018-08-22 LAB — CBC
HCT: 27.8 % — ABNORMAL LOW (ref 36.0–46.0)
Hemoglobin: 9.3 g/dL — ABNORMAL LOW (ref 12.0–15.0)
MCH: 29.9 pg (ref 26.0–34.0)
MCHC: 33.5 g/dL (ref 30.0–36.0)
MCV: 89.4 fL (ref 80.0–100.0)
Platelets: 165 10*3/uL (ref 150–400)
RBC: 3.11 MIL/uL — ABNORMAL LOW (ref 3.87–5.11)
RDW: 13.7 % (ref 11.5–15.5)
WBC: 16.3 10*3/uL — ABNORMAL HIGH (ref 4.0–10.5)
nRBC: 0 % (ref 0.0–0.2)

## 2018-08-22 LAB — RPR: RPR Ser Ql: NONREACTIVE

## 2018-08-22 NOTE — Lactation Note (Signed)
This note was copied from a baby's chart. Lactation Consultation Note  Patient Name: Molly Morton [id number] Date: 08/22/2018 Reason for consult: Follow-up assessment;Difficult latch   Maternal Data Has patient been taught Hand Expression?: Yes Does the patient have breastfeeding experience prior to this delivery?: No  Feeding Feeding Type: Breast Fed  LATCH Score Latch: Repeated attempts needed to sustain latch, nipple held in mouth throughout feeding, stimulation needed to elicit sucking reflex.  Audible Swallowing: A few with stimulation  Type of Nipple: Everted at rest and after stimulation  Comfort (Breast/Nipple): Soft / non-tender  Hold (Positioning): Assistance needed to correctly position infant at breast and maintain latch.  LATCH Score: 7  Interventions Interventions: Breast feeding basics reviewed;Assisted with latch;Skin to skin;Support pillows  Lactation Tools Discussed/Used     Consult Status Consult Status: Follow-up  Pt called out to [facility] for assistance with BF. This is family's first baby and baby has been breastfeeding well. LC assisted MOB with support pillows and used a blanket as well. Baby was able to latch onto MOB's breast w/o little help and LC pulled baby's chin down to widen latch (deeper latch).   LC educated parents on transitional stool and milk, clusterfeeding, feeding cues, I/Os, positioning for latch, and where and how to receive help.   Burnadette Peter 08/22/2018, 10:53 AM

## 2018-08-22 NOTE — Progress Notes (Signed)
Subjective: Postpartum Day 1: Cesarean Delivery Patient reports pain adequately controlled  Objective: Vital signs in last 24 hours: Temp:  [98.1 F (36.7 C)-98.6 F (37 C)] 98.6 F (37 C) (06/03 0354) Pulse Rate:  [68-102] 90 (06/03 0354) Resp:  [11-32] 16 (06/03 0354) BP: (99-143)/(51-103) 125/54 (06/03 0354) SpO2:  [95 %-100 %] 96 % (06/03 0550)  Physical Exam:  General: alert and cooperative Lochia: appropriate Uterine Fundus: firm Incision: no significant drainage DVT Evaluation: No evidence of DVT seen on physical exam.  Recent Labs    08/20/18 1804 08/22/18 0619  HGB 11.6* 9.3*  HCT 34.1* 27.8*    Assessment/Plan: Status post Cesarean section. Doing well postoperatively.  Continue current care. D/c IV  Ihor Austin Calan Doren 08/22/2018, 7:25 AM

## 2018-08-22 NOTE — Anesthesia Postprocedure Evaluation (Signed)
Anesthesia Post Note  Patient: Molly Morton  Procedure(s) Performed: CESAREAN SECTION (N/A )  Patient location during evaluation: Mother Baby Anesthesia Type: Epidural Level of consciousness: awake and alert and oriented Pain management: pain level controlled Vital Signs Assessment: post-procedure vital signs reviewed and stable Respiratory status: spontaneous breathing and nonlabored ventilation Cardiovascular status: stable Postop Assessment: no headache, no backache, no apparent nausea or vomiting, patient able to bend at knees, adequate PO intake and able to ambulate Anesthetic complications: no     Last Vitals:  Vitals:   08/22/18 0550 08/22/18 0751  BP:  (!) 95/53  Pulse:  78  Resp:  20  Temp:  37.3 C  SpO2: 96% 98%    Last Pain:  Vitals:   08/22/18 0751  TempSrc: Oral  PainSc:                  Zachary George

## 2018-08-23 MED ORDER — FERROUS SULFATE 325 (65 FE) MG PO TABS
325.0000 mg | ORAL_TABLET | Freq: Two times a day (BID) | ORAL | Status: DC
  Administered 2018-08-23 – 2018-08-24 (×4): 325 mg via ORAL
  Filled 2018-08-23 (×4): qty 1

## 2018-08-23 NOTE — Lactation Note (Signed)
This note was copied from a baby's chart. Lactation Consultation Note  Patient Name: Molly Morton [id number] Date: 08/23/2018 Reason for consult: Follow-up assessment   Maternal Data    Feeding Feeding Type: Breast Fed  BAby had not fed in 6 hours. She had attempted but was not latching.n Dr Vassie Loll was called and donor milk was ordered and given after consented.  LATCH Score Latch: Repeated attempts needed to sustain latch, nipple held in mouth throughout feeding, stimulation needed to elicit sucking reflex.  Audible Swallowing: Spontaneous and intermittent  Type of Nipple: Everted at rest and after stimulation(short)  Comfort (Breast/Nipple): Soft / non-tender  Hold (Positioning): Assistance needed to correctly position infant at breast and maintain latch.  LATCH Score: 8  Interventions Interventions: Breast feeding basics reviewed;Assisted with latch;Skin to skin;Hand express;Support pillows;DEBP  Lactation Tools Discussed/Used Tools: Nipple Shields Nipple shield size: 20 Breast pump type: Double-Electric Breast Pump(has a Medela at home) Pump Review: Setup, frequency, and cleaning;Milk Storage Initiated by:: [name] Date initiated:: 08/23/18   Consult Status Consult Status: PRN Follow-up type: In-patient    Trudee Grip 08/23/2018, 4:50 PM

## 2018-08-23 NOTE — Progress Notes (Signed)
Post Op Day 2  Subjective: Doing well, no concerns. Ambulating without difficulty, pain managed with PO meds, tolerating regular diet, and voiding without difficulty.   No fever/chills, chest pain, shortness of breath, nausea/vomiting, or leg pain. No nipple or breast pain.   Objective: BP 114/72 (BP Location: Left Arm)   Pulse 82   Temp 98.4 F (36.9 C) (Oral)   Resp 20   Ht 5\' 6"  (1.676 m)   Wt 108.9 kg   LMP 11/25/2017   SpO2 99%   Breastfeeding Unknown   BMI 38.74 kg/m    Physical Exam:  General: alert, cooperative, appears stated age and no distress Breasts: soft/nontender CV: RRR Pulm: nl effort, CTABL Abdomen: soft, non-tender, active bowel sounds Uterine Fundus: firm Incision: healing well, no significant drainage, no dehiscence, no significant erythema Lochia: appropriate DVT Evaluation: No evidence of DVT seen on physical exam. No cords or calf tenderness. No significant calf/ankle edema.  Recent Labs    08/20/18 1804 08/22/18 0619  HGB 11.6* 9.3*  HCT 34.1* 27.8*  WBC 11.8* 16.3*  PLT 213 165    Assessment/Plan: 28 y.o. G1P1001 postop day # 2  -Continue routine postpartum care -Lactation consult PRN for breastfeeding  -Acute blood loss anemia - hemodynamically stable and asymptomatic; start PO ferrous sulfate BID with stool softeners  -Immunization status: all immunizations up to date -Peds wants to keep baby another night for feeding and patient wants to stay to support this. Plan for discharge home tomorrow.   Disposition: Continue inpatient postpartum care    LOS: 3 days   Genia Del, CNM 08/23/2018, 8:50 AM   ----- Genia Del Certified Nurse Midwife [facility]

## 2018-08-23 NOTE — Discharge Instructions (Signed)
Please call your doctor or return to the ER if you experience any chest pains, shortness of breath, dizziness, visual changes, fever greater than 101, any heavy bleeding (saturating more than 1 pad per hour), large clots, or foul smelling discharge, any worsening abdominal pain and cramping that is not controlled by pain medication, any breast concerns (redness/pain), or any signs of postpartum depression. No tampons, enemas, douches, or sexual intercourse for 6 weeks. Also avoid tub baths, hot tubs, or swimming for 6 weeks.    Check your incision daily for any signs of infection such as redness, warmth, swelling, increased pain, or pus/foul smelling draiange   Activity: do not lift over 10 lbs for 6 weeks  No driving for 2 weeks  Pelvic rest for 6 weeks

## 2018-08-24 ENCOUNTER — Ambulatory Visit: Payer: Self-pay

## 2018-08-24 MED ORDER — NORETHINDRONE 0.35 MG PO TABS: 1.0000 | ORAL_TABLET | Freq: Every day | ORAL | 3 refills | Status: DC

## 2018-08-24 MED ORDER — IBUPROFEN 800 MG PO TABS: 800.0000 mg | ORAL_TABLET | Freq: Four times a day (QID) | ORAL | 0 refills | Status: DC

## 2018-08-24 MED ORDER — SENNOSIDES-DOCUSATE SODIUM 8.6-50 MG PO TABS: 2.0000 | ORAL_TABLET | ORAL | 1 refills | Status: DC

## 2018-08-24 MED ORDER — OXYCODONE HCL 5 MG PO TABS: 5.0000 mg | ORAL_TABLET | ORAL | 0 refills | Status: DC | PRN

## 2018-08-24 MED ORDER — SIMETHICONE 80 MG PO CHEW: 80.0000 mg | CHEWABLE_TABLET | Freq: Three times a day (TID) | ORAL | 0 refills | Status: DC

## 2018-08-24 NOTE — Progress Notes (Signed)
Discharge order received from doctor. Incision cleaning kit given. Reviewed discharge instructions and prescriptions with patient and answered all questions. Follow up appointment instructions given. Patient verbalized understanding. ID bands checked. Patient discharged home with infant via wheelchair by nursing/auxillary.    Hilbert Bible, RN

## 2018-08-24 NOTE — Plan of Care (Signed)
Vs stable; pt up ad lib; tolerating regular diet; taking motrin and oxycodone for pain control; breastfeeding and getting more independent with feeds; using nipple shield and supplementing at the breast (due to baby having 10% weight loss from birth)

## 2018-08-24 NOTE — Lactation Note (Addendum)
This note was copied from a baby's chart. Lactation Consultation Note  Patient Name: Molly Morton [id number] Date: 08/24/2018  Noted on hx that mom has breast implants, unknown of above or below muscle or placement of incision    Maternal Data  large soft breasts that we have difficulty maintaining good adhesion of nipple shield to breast, mom shown how to support breast to get maximum amt of breast into shield, baby tends to push nipple out with tongue otherwise  Feeding  Nipple shield started after birth , baby would not latch well without, will latch with well but will not stay on breast without flow from breast, baby has yellow skin color and needs supplement at present plus 10 % wt loss since birth, parents had been giving supplement of Donor breast milk with curved tip syringe at breast but needed to push milk in as baby did not have adequate latch and pressure to pull milk through syringe, I recommended trying SNS to have baby have slow flow but still have to work for milk, SNS started with instruction in use and cleaning, this seemed to work better for the feeding, baby tends to keep tongue hunched in back of mouth and this helped baby extend tongue sl. Better, I question whether baby may have a tight frenulum given bunching of tongue at back of mouth and poor suction, frenulum insertion is not at tip of tongue, also pushes nipple in and out of mouth with tongue instead of drawing nipple back into mouth, good support of breast with pillows, hand and blankets helps baby keep breast deep in mouth and intake of breast milk, baby took in 15 cc of DBM per sns and fell asleep at breast      [facility] Score                   Interventions    Lactation Tools Discussed/Used  Recommended mom continue use of shield and supplementation until wt check on MOnday, may increase amt of DBM tomorrow as needed and continue use of shield until breastfeeding going well, wt increased and adequate  milk supply,suggested mom pump breasts after some feedings to increase breast stimulation to ensure adequate milk production, given Medela nipple shield handout and recommendations in weaning baby from shield, call for assist if having any trouble weaning from shield or for questions or concerns     Consult Status      Molly Morton 08/24/2018, 8:25 PM

## 2018-08-24 NOTE — Progress Notes (Signed)
Post Partum Day 3 Subjective: Doing well, no complaints.  Tolerating regular diet, pain with PO meds, voiding and ambulating without difficulty. Having challenges with infant feeding.   No CP SOB Fever,Chills, N/V or leg pain; denies nipple or breast pain; no HA change of vision, RUQ/epigastric pain  Objective: BP 120/74 (BP Location: Right Arm)   Pulse 73   Temp 97.8 F (36.6 C) (Axillary)   Resp 18   Ht 5\' 6"  (1.676 m)   Wt 108.9 kg   LMP 11/25/2017   SpO2 100% Comment: Room Air  Breastfeeding Unknown   BMI 38.74 kg/m    Physical Exam:  General: NAD Breasts: soft/nontender, flat nipples, using nipple shield and SNS for feeding.  CV: RRR Pulm: nl effort, CTABL Abdomen: soft, NT, BS x 4 Incision: no erythema or drainage Lochia: small Uterine Fundus: fundus firm and 3 fb below umbilicus DVT Evaluation: no cords, ttp LEs   Recent Labs    08/22/18 0619  HGB 9.3*  HCT 27.8*  WBC 16.3*  PLT 165    Assessment/Plan: 28 y.o. G1P1001 postpartum day # 3  - Continue routine PP care - Lactation consult prn.  - Discussed contraceptive options including implant, IUDs hormonal and non-hormonal, injection, pills/ring/patch, condoms, and NFP. Pt desires progestin only pills, will start at 37mo PP.  - Acute blood loss anemia - hemodynamically stable and asymptomatic; continue po ferrous sulfate BID with stool softeners  - Immunization status:  all Imms up to date    Disposition: Does desire Dc home today.     Randa Ngo, CNM 08/24/2018  12:34 PM

## 2018-09-01 ENCOUNTER — Inpatient Hospital Stay: Admission: RE | Admit: 2018-09-01 | Payer: 59 | Source: Home / Self Care | Admitting: *Deleted

## 2018-10-15 ENCOUNTER — Other Ambulatory Visit: Payer: Self-pay | Admitting: Internal Medicine

## 2018-10-15 DIAGNOSIS — M5441 Lumbago with sciatica, right side: Secondary | ICD-10-CM

## 2018-11-05 ENCOUNTER — Ambulatory Visit: Admission: RE | Admit: 2018-11-05 | Payer: 59 | Source: Ambulatory Visit

## 2019-01-19 ENCOUNTER — Other Ambulatory Visit: Payer: Self-pay

## 2019-01-19 ENCOUNTER — Ambulatory Visit
Admission: RE | Admit: 2019-01-19 | Discharge: 2019-01-19 | Disposition: A | Discharge status: Home / Self Care | Payer: 59 | Source: Ambulatory Visit | Attending: Internal Medicine | Admitting: Internal Medicine

## 2019-01-19 DIAGNOSIS — M5441 Lumbago with sciatica, right side: Secondary | ICD-10-CM | POA: Diagnosis not present

## 2022-05-07 ENCOUNTER — Other Ambulatory Visit: Payer: Self-pay

## 2022-05-07 ENCOUNTER — Emergency Department
Admission: EM | Admit: 2022-05-07 | Discharge: 2022-05-07 | Disposition: A | Discharge status: Home / Self Care | Payer: No Typology Code available for payment source | Attending: Emergency Medicine | Admitting: Emergency Medicine

## 2022-05-07 ENCOUNTER — Emergency Department: Payer: No Typology Code available for payment source

## 2022-05-07 DIAGNOSIS — Z20822 Contact with and (suspected) exposure to covid-19: Secondary | ICD-10-CM | POA: Insufficient documentation

## 2022-05-07 DIAGNOSIS — R0789 Other chest pain: Secondary | ICD-10-CM | POA: Insufficient documentation

## 2022-05-07 DIAGNOSIS — R079 Chest pain, unspecified: Secondary | ICD-10-CM

## 2022-05-07 LAB — BASIC METABOLIC PANEL
Anion gap: 10 (ref 5–15)
BUN: 13 mg/dL (ref 6–20)
CO2: 26 mmol/L (ref 22–32)
Calcium: 9.3 mg/dL (ref 8.9–10.3)
Chloride: 101 mmol/L (ref 98–111)
Creatinine, Ser: 0.93 mg/dL (ref 0.44–1.00)
GFR, Estimated: >60 mL/min (ref >60)
Glucose, Bld: 106 mg/dL — ABNORMAL HIGH (ref 70–99)
Potassium: 3.9 mmol/L (ref 3.5–5.1)
Sodium: 137 mmol/L (ref 135–145)

## 2022-05-07 LAB — URINALYSIS, COMPLETE (UACMP) WITH MICROSCOPIC
Bilirubin Urine: NEGATIVE
Glucose, UA: NEGATIVE mg/dL
Hgb urine dipstick: NEGATIVE
Ketones, ur: NEGATIVE mg/dL
Leukocytes,Ua: NEGATIVE
Nitrite: NEGATIVE
Protein, ur: NEGATIVE mg/dL
Specific Gravity, Urine: 1.017 (ref 1.005–1.030)
pH: 5 (ref 5.0–8.0)

## 2022-05-07 LAB — CBC
HCT: 42.7 % (ref 36.0–46.0)
Hemoglobin: 14.2 g/dL (ref 12.0–15.0)
MCH: 28.5 pg (ref 26.0–34.0)
MCHC: 33.3 g/dL (ref 30.0–36.0)
MCV: 85.7 fL (ref 80.0–100.0)
Platelets: 469 10*3/uL — ABNORMAL HIGH (ref 150–400)
RBC: 4.98 MIL/uL (ref 3.87–5.11)
RDW: 12.3 % (ref 11.5–15.5)
WBC: 14.7 10*3/uL — ABNORMAL HIGH (ref 4.0–10.5)
nRBC: 0 % (ref 0.0–0.2)

## 2022-05-07 LAB — RESP PANEL BY RT-PCR (RSV, FLU A&B, COVID)  RVPGX2
Influenza A by PCR: NEGATIVE
Influenza B by PCR: NEGATIVE
Resp Syncytial Virus by PCR: NEGATIVE
SARS Coronavirus 2 by RT PCR: NEGATIVE

## 2022-05-07 LAB — TROPONIN I (HIGH SENSITIVITY): Troponin I (High Sensitivity): 3 ng/L (ref <18)

## 2022-05-07 LAB — POC URINE PREG, ED: Preg Test, Ur: NEGATIVE

## 2022-05-07 MED ORDER — IBUPROFEN 400 MG PO TABS
400.0000 mg | ORAL_TABLET | Freq: Once | ORAL | Status: DC
  Filled 2022-05-07: qty 1

## 2022-05-07 NOTE — ED Provider Notes (Signed)
[facility] Provider Note    Event Date/Time   First MD Initiated Contact with Patient 05/07/22 1703     (approximate)  History   Chief Complaint: Chest Pain  HPI  Molly Morton is a 32 y.o. female with a past medical history of anxiety who presents to the emergency department for intermittent chest discomfort.  According to the patient approximately 6 days ago she developed a sinus infection which she describes as sinus congestion subjective fever and chills.  Patient states a history of the same she called her PCP who called her in an antibiotic.  She states since that time she has been experiencing some intermittent pain or tightness in the chest.  Denies any shortness of breath.  No pleuritic pain.  No leg pain or swelling.  Patient states she thought it could be anxiety but wanted to be safe so she came to the emergency department for evaluation.  Physical Exam   Triage Vital Signs: ED Triage Vitals  Enc Vitals Group     BP 05/07/22 1613 (!) 153/109     Pulse Rate 05/07/22 1613 100     Resp 05/07/22 1613 18     Temp 05/07/22 1613 98.6 F (37 C)     Temp src --      SpO2 05/07/22 1613 94 %     Weight 05/07/22 1610 285 lb (129.3 kg)     Height 05/07/22 1610 5' 6"$  (1.676 m)     Head Circumference --      Peak Flow --      Pain Score 05/07/22 1609 4     Pain Loc --      Pain Edu? --      Excl. in [place]? --     Most recent vital signs: Vitals:   05/07/22 1613  BP: (!) 153/109  Pulse: 100  Resp: 18  Temp: 98.6 F (37 C)  SpO2: 94%    General: Awake, no distress.  CV:  Good peripheral perfusion.  Regular rate and rhythm  Resp:  Normal effort.  Equal breath sounds bilaterally.  Chest wall is nontender to palpation Abd:  No distention.  Soft, nontender.  No rebound or guarding.  ED Results / Procedures / Treatments   EKG  EKG viewed and interpreted by myself shows a normal sinus rhythm at 98 bpm with a narrow QRS, normal axis, normal  intervals, no concerning ST changes.  RADIOLOGY  I have reviewed and interpreted the chest x-ray images.  No consolidation seen on my evaluation. Radiology is read the chest x-ray as negative.   MEDICATIONS ORDERED IN ED: Medications - No data to display   IMPRESSION / MDM / [place] / ED COURSE  I reviewed the triage vital signs and the nursing notes.  Patient's presentation is most consistent with acute presentation with potential threat to life or bodily function.  Patient presents emergency department for intermittent chest pain as well as sinus congestion.  States over the past week she has also had intermittent headaches and subjective chills.  Overall the patient appears well, reassuring physical exam, slight hypertension otherwise reassuring vital signs.  Patient's lab work shows a slight leukocytosis otherwise normal CBC, troponin negative, chemistry normal.  Patient's chest x-ray shows no concerning findings.  EKG is reassuring as well.  Given these findings I do not believe her pain is due to any cardiac Kolls.  However given her sinus pain congestion headache subjective chills and chest discomfort  we will obtain a COVID/flu/RSV swab as a precaution.  Will also obtain a urinalysis and pregnancy test as a precaution.  Patient's COVID/flu/RSV is negative.  Urinalysis reassuring and pregnancy test is negative.  Patient has a reassuring workup in the emergency department we will discharge with PCP follow-up.  Patient agreeable.  FINAL CLINICAL IMPRESSION(S) / ED DIAGNOSES   chest pain    Note:  This document was prepared using Dragon voice recognition software and may include unintentional dictation errors.   Harvest Dark, MD 05/07/22 1958

## 2022-05-07 NOTE — ED Triage Notes (Signed)
Pt states coming in with chest pressure and dizziness. Pt states these symptoms all started this past Wednesday. Pt states history of anxiety but no cardiac history. Pt reports current pain is a headache, 4/10

## 2023-09-19 NOTE — Progress Notes (Signed)
 Molly Morton is here after a positive home pregnancy test.  She is a 33 y.o., female, [name].  History of Present Illness Molly Morton had a period 07/16/2023 but she hasn't had one since. She usually has regular every 28-30 days  . She was not using any kind of contraception. This was not a planned pregnancy but was not unexpected.  She is currently pregnant with an estimated due date of April 21, 2024, based on her last menstrual period on July 11, 2023. Her menstrual cycles were fairly regular prior to this pregnancy. The pregnancy was not planned but not unexpected, as she was not using any form of contraception. She experiences fatigue and occasional nausea, but no severe morning sickness.  Her previous pregnancy involved a primary C-section after a prolonged labor of 17 hours, during which she only dilated to 5 centimeters. She was induced at 38 weeks and 2 days due to elevated blood pressure.  She is currently taking Zoloft  50 mg for anxiety, which she started during her last pregnancy due to severe pregnancy-related anxiety. She also has clonazepam on hand for occasional use during panic attacks. She has a history of anxiety, which was particularly severe during her last pregnancy, leading to daily panic attacks.  She is concerned about weight gain during this pregnancy, having recently lost weight prior to becoming pregnant. She is conscious of her diet and is considering ways to manage her weight without increasing anxiety. She has been consistent with her weight at home and during visits with her other healthcare provider.  She is currently experiencing nausea and fatigue and denies bleeding, contractions, cramping or leaking.   The following portions of the patient's history were reviewed and updated as appropriate: Past Medical History:  has a past medical history of Anxiety, History of eczema, and Migraine headache.  Past Surgical History:  has a past surgical history that includes  Cesarean section (08/21/2018) and Augmentation mammaplasty. Family History: family history includes Cancer in her father; Diabetes in her father; Heart disease in her mother; High blood pressure (Hypertension) in her mother. Social History:  reports that she has never smoked. She has never used smokeless tobacco. She reports that she does not drink alcohol and does not use drugs. Current Meds: has a current medication list which includes the following prescription(s): clonazepam and sertraline .  Allergies: has no known allergies.  OB history:  OB History  Gravida Para Term Preterm AB Living  3 1 1  0 1 1  SAB IAB Ectopic Molar Multiple Live Births  1 0 0   1    # Outcome Date GA Lbr Len/2nd Weight Sex Type Anes PTL Lv  3 Current           2 Term 08/21/18 [redacted]w[redacted]d  3.24 kg (7 lb 2.3 oz) F CS-LTranv EPI, Spinal  LIV     Complications: Arrested active phase of labor (HHS-HCC), Cephalopelvic disproportion (HHS-HCC)  1 SAB             Complications: Primary c/section   Patient Active Problem List  Diagnosis  . Anxiety  . History of eczema  . Migraine headache  . Elevated antinuclear antibody (ANA) level  . Attention deficit hyperactivity disorder (ADHD), combined type  . DDD (degenerative disc disease), lumbar  . Pregnancy with uncertain fetal viability, single or unspecified fetus (HHS-HCC)  . Morbid obesity with BMI of 50.0-59.9, adult (CMS/HHS-HCC)  . Pain syndrome, chronic  . History of gestational hypertension  . Obesity in pregnancy, antepartum (HHS-HCC)  .  Previous cesarean section  . Anxiety in pregnancy, antepartum (HHS-HCC)    Review of Systems Pertinent positives and negatives in the HPI, otherwise an 8-system review of systems was negative.     Physical Exam: Vitals:   09/19/23 1407  BP: 136/83  Pulse: 83  Height: 165.1 cm (5' 5)     BP 136/83   Pulse 83   Ht 165.1 cm (5' 5)   LMP 07/16/2023 (Exact Date)   BMI 43.97 kg/m    General Appearance:     Well-developed, well-nourished, no acute distress, appears stated age  Lungs:     Respirations unlabored  Neuro: Psych: Uterus:   Alert, oriented x3  Appropriate mood and insight, judgement intact  Enlarged, c/w 8-[redacted] week gestation    US : 09/19/2023 Single, viable IUP seen  CRL = 21.5mm = [redacted]w[redacted]d, c/w dates  EDC by US  04/25/2024 FHT: 183 bpm Cervical length: 5.23cm Right ovary: WNL, CLC 25 mm Left ovary: WNL  Assessment:  The primary encounter diagnosis was Amenorrhea. Diagnoses of Previous cesarean section, History of gestational hypertension, Obesity in pregnancy, antepartum (HHS-HCC), and Anxiety in pregnancy, antepartum (HHS-HCC) were also pertinent to this visit.  Assessment & Plan Pregnancy Confirmed pregnancy with EDD April 21, 2024. Discussed fatigue, nausea, and increased fatigue in subsequent pregnancies. Reviewed cesarean section history and delivery options. Discussed genetic screening options and insurance coverage. - Schedule OB appointment in 2-3 weeks for lab work and vaginal swab. - Discuss genetic screening options at next appointment. - Monitor weight at home and report. - Encourage regular prenatal care and follow-up.  Previous cesarean section Previous c/section for arrest of labor and CPD. Unsure if she desires TOLAC versus repeat c/section. Will consider her options and decide.   History of gestational hypertension Gestational hypertension at 38 weeks leading to induction of labor. She did not require postpartum antihypertensives. Initial BP today mildly elevated at 136/83. Possible underlying chronic hypertension. Will continue to monitor blood pressures  - Recheck B/P at NOB  - Low dose ASA starting at 12-16 weeks  - Baseline Preeclampsia labs with NOB labs   Obesity Pregnancy occurred after weight loss with GLP1 medication. She is anxious about regular weight checks and seeing her weight go back up. Discussed the importance of weight checks at each  appointments and how we can decrease the stress associated with them. Option given to check weight at home versus facing away from scale here at the clinic. Molly Morton strongly desires to avoid public weigh-ins and will likely check her weight at home and let us  know what it is when she comes in for her appointments.   Anxiety Pregnancy-related anxiety managed with sertraline  and clonazepam. Discussed potential sertraline  dose increase and clonazepam use guidelines. - Continue sertraline  50 mg daily. - Use clonazepam as needed, avoid daily use. - Consider increasing sertraline  if symptoms worsen. - Follow up with mental health provider if needed.  General Health Maintenance Pap smear up to date. Discussed healthy lifestyle during pregnancy, including exercise, diet, and weight monitoring. Reviewed potential early diabetes screening and anatomy ultrasound. - Encourage regular exercise and balanced diet. - Monitor weight gain and report changes. - Prepare for potential early diabetes screening and anatomy ultrasound.  Pregnancy confirmed. Patient's last menstrual period was 07/16/2023 (exact date).  Firm LMP: She is [redacted]w[redacted]d based on LMP, consistent with  early ultrasound, [redacted]w[redacted]d, with an EDD of  04/21/2024 - Working EDD  Prenatal screening discussed: First trimester screening with cfDNA.  Arryanna desires this option .  Carrier screening for CF/SMA if not previously done. Kinze desires this option  Second trimester screening with AFP between 15-20 weeks.  Pap smear up to date - 01/30/2023 NILM, hrHPV neg   Early screen:  Early diabetes screen with 1 hour GTT  Baseline CMP and urine PCR  Early pregnancy education:  Risk factors discussed.   Healthy dietary and lifestyle choices stressed Recommended weight gain discussed.   Exercise was encouraged.  Breastfeeding encouraged.  continue taking prenatal vitamins.  Medications reviewed for appropriateness during pregnancy.  Vaginal bleeding  precautions and warning s/s reviewed  The patient was counseled on prenatal screening/testing, expectations for prenatal appointments and ultrasounds, the group practice setting with multiple ob providers who will be sharing in her prenatal care.  We discussed resources for childbirth and breastfeeding education at the [facility]. The [facility] OBGYN Prenatal Booklet was also reviewed with the patient.  No orders of the defined types were placed in this encounter.   Return in about 2 weeks (around 10/03/2023) for new OB appointment.    Attestation Statement:   I personally performed the service, non-incident to. (WP)   ANNA ROSALINE PILLOW, CNM

## 2023-09-23 ENCOUNTER — Emergency Department
Admission: EM | Admit: 2023-09-23 | Discharge: 2023-09-23 | Disposition: A | Discharge status: Home / Self Care | Attending: Emergency Medicine | Admitting: Emergency Medicine

## 2023-09-23 ENCOUNTER — Encounter: Payer: Self-pay | Admitting: Emergency Medicine

## 2023-09-23 ENCOUNTER — Emergency Department

## 2023-09-23 ENCOUNTER — Other Ambulatory Visit: Payer: Self-pay

## 2023-09-23 DIAGNOSIS — Z3A10 10 weeks gestation of pregnancy: Secondary | ICD-10-CM | POA: Diagnosis not present

## 2023-09-23 DIAGNOSIS — K2100 Gastro-esophageal reflux disease with esophagitis, without bleeding: Secondary | ICD-10-CM | POA: Insufficient documentation

## 2023-09-23 DIAGNOSIS — O99611 Diseases of the digestive system complicating pregnancy, first trimester: Secondary | ICD-10-CM | POA: Insufficient documentation

## 2023-09-23 DIAGNOSIS — R079 Chest pain, unspecified: Secondary | ICD-10-CM

## 2023-09-23 DIAGNOSIS — O26891 Other specified pregnancy related conditions, first trimester: Secondary | ICD-10-CM | POA: Diagnosis present

## 2023-09-23 LAB — BASIC METABOLIC PANEL WITH GFR
Anion gap: 9 (ref 5–15)
BUN: 12 mg/dL (ref 6–20)
CO2: 25 mmol/L (ref 22–32)
Calcium: 9.6 mg/dL (ref 8.9–10.3)
Chloride: 101 mmol/L (ref 98–111)
Creatinine, Ser: 0.65 mg/dL (ref 0.44–1.00)
GFR, Estimated: >60 mL/min (ref >60)
Glucose, Bld: 99 mg/dL (ref 70–99)
Potassium: 3.6 mmol/L (ref 3.5–5.1)
Sodium: 135 mmol/L (ref 135–145)

## 2023-09-23 LAB — CBC
HCT: 39 % (ref 36.0–46.0)
Hemoglobin: 13.3 g/dL (ref 12.0–15.0)
MCH: 29.6 pg (ref 26.0–34.0)
MCHC: 34.1 g/dL (ref 30.0–36.0)
MCV: 86.7 fL (ref 80.0–100.0)
Platelets: 342 K/uL (ref 150–400)
RBC: 4.5 MIL/uL (ref 3.87–5.11)
RDW: 13.2 % (ref 11.5–15.5)
WBC: 11.8 K/uL — ABNORMAL HIGH (ref 4.0–10.5)
nRBC: 0 % (ref 0.0–0.2)

## 2023-09-23 LAB — HEPATIC FUNCTION PANEL
ALT: 14 U/L (ref 0–44)
AST: 15 U/L (ref 15–41)
Albumin: 3.5 g/dL (ref 3.5–5.0)
Alkaline Phosphatase: 50 U/L (ref 38–126)
Bilirubin, Direct: 0.1 mg/dL (ref 0.0–0.2)
Indirect Bilirubin: 1.2 mg/dL — ABNORMAL HIGH (ref 0.3–0.9)
Total Bilirubin: 1.3 mg/dL — ABNORMAL HIGH (ref 0.0–1.2)
Total Protein: 6.7 g/dL (ref 6.5–8.1)

## 2023-09-23 LAB — TROPONIN I (HIGH SENSITIVITY): Troponin I (High Sensitivity): <2 ng/L (ref <18)

## 2023-09-23 LAB — LIPASE, BLOOD: Lipase: 29 U/L (ref 11–51)

## 2023-09-23 MED ORDER — FAMOTIDINE 20 MG PO TABS
20.0000 mg | ORAL_TABLET | Freq: Once | ORAL | Status: AC
  Administered 2023-09-23: 20 mg via ORAL
  Filled 2023-09-23: qty 1

## 2023-09-23 MED ORDER — ALUM & MAG HYDROXIDE-SIMETH 200-200-20 MG/5ML PO SUSP
15.0000 mL | Freq: Once | ORAL | Status: AC
  Administered 2023-09-23: 15 mL via ORAL
  Filled 2023-09-23: qty 30

## 2023-09-23 NOTE — ED Provider Notes (Signed)
 [facility] Provider Note    Event Date/Time   First MD Initiated Contact with Patient 09/23/23 [phone]     (approximate)   History   Chest Pain   HPI  Molly Morton is a 33 y.o. female and interpreted by me at 7 AM heart rate 70 QRS 80 QTc 430  Past medical history includes current pregnancy as well as anxiety.  No noted cardiac history  Relates that last night when she laid down she had a burning discomfort in her mid chest that lasted all night she did not sleep well.  She took a Tums that helped briefly.  She does not have any chest heavy chest pain she is not short of breath.  No leg swelling no history of blood clots.  She does not take any medications right now except for Zoloft  which her OB team is aware of.  She reports she did suffer from acid reflux later in her first pregnancy.  She is questioning if this might be acid reflux but wanted to get checked out as she is traveling to [place] later today for a family vacation   Patient is [redacted] weeks pregnant.  Has been no abdominal pain no vaginal bleeding or discharge.  Physical Exam   Triage Vital Signs: ED Triage Vitals  Encounter Vitals Group     BP 09/23/23 0656 130/81     Girls Systolic BP Percentile --      Girls Diastolic BP Percentile --      Boys Systolic BP Percentile --      Boys Diastolic BP Percentile --      Pulse Rate 09/23/23 0656 72     Resp 09/23/23 0656 18     Temp 09/23/23 0656 97.8 F (36.6 C)     Temp Source 09/23/23 0656 Oral     SpO2 09/23/23 0656 100 %     Weight 09/23/23 0657 245 lb (111.1 kg)     Height 09/23/23 0657 5' 6 (1.676 m)     Head Circumference --      Peak Flow --      Pain Score 09/23/23 0657 4     Pain Loc --      Pain Education --      Exclude from Growth Chart --     Most recent vital signs: Vitals:   09/23/23 0656 09/23/23 0730  BP: 130/81 119/62  Pulse: 72   Resp: 18 15  Temp: 97.8 F (36.6 C)   SpO2: 100%       General: Awake, no distress.  She is very pleasant CV:  Good peripheral perfusion.  Normal tones and Resp:  Normal effort.  Clear bilaterally Abd:  No distention.  Soft nontender nondistended.  Not obviously gravid.  No pain at McBurney's point.  Negative Murphy Other:  No lower extremity edema venous cords or congestion.  She appears warm and well-perfused very pleasant   ED Results / Procedures / Treatments   Labs (all labs ordered are listed, but only abnormal results are displayed) Labs Reviewed  CBC - Abnormal; Notable for the following components:      Result Value   WBC 11.8 (*)    All other components within normal limits  HEPATIC FUNCTION PANEL - Abnormal; Notable for the following components:   Total Bilirubin 1.3 (*)    Indirect Bilirubin 1.2 (*)    All other components within normal limits  BASIC METABOLIC PANEL WITH GFR  LIPASE, BLOOD  POC URINE PREG, ED  TROPONIN I (HIGH SENSITIVITY)     EKG  Interpreted by me at 7 AM heart rate 70 QRS 80 QTc 430 Normal sinus rhythm no evidence of ischemia or abnormality.  No evidence of right heart strain.  No tachycardia   RADIOLOGY  Chest x-ray interpreted by me is clear  DG Chest Portable 1 View Result Date: 09/23/2023 CLINICAL DATA:  Chest pain EXAM: PORTABLE CHEST 1 VIEW COMPARISON:  05/07/2022 FINDINGS: Heart size and mediastinal contours appear normal. No pleural fluid, interstitial edema or airspace disease. Visualized osseous structures are unremarkable. IMPRESSION: No active disease. Electronically Signed   By: Waddell Calk M.D.   On: 09/23/2023 07:15      PROCEDURES:  Critical Care performed: No  Procedures   MEDICATIONS ORDERED IN ED: Medications  famotidine  (PEPCID ) tablet 20 mg (20 mg Oral Given 09/23/23 0738)  alum & mag hydroxide-simeth (MAALOX/MYLANTA) 200-200-20 MG/5ML suspension 15 mL (15 mLs Oral Given 09/23/23 9261)     IMPRESSION / MDM / ASSESSMENT AND PLAN / ED COURSE  I reviewed the  triage vital signs and the nursing notes.                              Differential diagnosis includes, but is not limited to, possible reflux, gastritis based on what she describes sort of a burning midsternal area.  She has no evidence of ACS and normal troponin her symptoms been ongoing since since throughout the evening and I am confident that the initial first troponin is fairly exclusive against ACS.  She additionally does not have any risk factors or findings that would be suggestive PE no pleuritic pain etc.  Her chest x-ray is clear she has no infectious symptoms.  She is awake alert pleasant.  No ripping tearing or back type pain.  No associate abdominal pain with very reassuring examination.  No active GYN symptoms  Patient's presentation is most consistent with acute complicated illness / injury requiring diagnostic workup.      Clinical Course as of 09/23/23 9167  Sat Sep 23, 2023  9247 Chest x-ray inter by me as normal.  Additionally her initial labs including CBC and metabolic panel are normal with a very slight leukocytosis noted which is quite nonspecific in pregnancy.  Normal troponin. [MQ]  [id number] Of note she has no pleuritic pain or dyspnea.  She has no clinical exam findings that would be high risk for thromboembolism.  Though she is pregnant, she would otherwise be negative by [facility] rule and my pretest probability for PE as causation is essentially 0 in his clinical case. [MQ]    Clinical Course User Index [MQ] Dicky Anes, MD   ----------------------------------------- 8:31 AM on 09/23/2023 ----------------------------------------- Patient requesting that she feels ready to discharge.  Medication has completely resolved the burning sensation. Return precautions and treatment recommendations and follow-up discussed with the patient who is agreeable with the plan.   FINAL CLINICAL IMPRESSION(S) / ED DIAGNOSES   Final diagnoses:  Chest pain with low risk for cardiac  etiology  Gastroesophageal reflux disease with esophagitis without hemorrhage     Rx / DC Orders   ED Discharge Orders     None        Note:  This document was prepared using Dragon voice recognition software and may include unintentional dictation errors.   Dicky Anes, MD 09/23/23 [phone]

## 2023-09-23 NOTE — ED Triage Notes (Signed)
 Patient ambulatory to triage with complaints of chest pain that has kept her from sleeping all night. Tried TUMS thinking it was heartburn initially but it has not subsided. Patient is currently approx [redacted] weeks pregnant and wants to ensure nothing is wrong.

## 2023-09-23 NOTE — ED Notes (Signed)
 This RN reviewed paperwork with pt. No further complaints or questions. Pt ambulated to lobby.

## 2023-10-04 LAB — OB RESULTS CONSOLE RUBELLA ANTIBODY, IGM: Rubella: IMMUNE

## 2023-10-04 LAB — OB RESULTS CONSOLE VARICELLA ZOSTER ANTIBODY, IGG: Varicella: IMMUNE

## 2023-10-04 LAB — OB RESULTS CONSOLE HEPATITIS B SURFACE ANTIGEN: Hepatitis B Surface Ag: NEGATIVE

## 2023-11-30 NOTE — Progress Notes (Signed)
 Patient's last menstrual period was 07/16/2023 (exact date). Estimated Date of Delivery: 04/21/24  33 y.o. [name] at [redacted]w[redacted]d  The primary encounter diagnosis was Supervision of high risk pregnancy in third trimester (HHS-HCC). Diagnoses of Other fatigue, Hair loss, Night sweats, History of gestational hypertension, Elevated blood pressure complicating pregnancy, antepartum (HHS-HCC), Previous cesarean section, Anxiety in pregnancy, antepartum (HHS-HCC), and Obesity in pregnancy, antepartum (HHS-HCC) were also pertinent to this visit.  S:   Patient concerns today:  - Night sweats, increase in heart rate, increase in BP, hair loss, dry skin, fatigue   Chief Complaint  Patient presents with  . Routine Prenatal Visit    Reports: see above  Denies bleeding, contractions, cramping or leaking.   O:   See [facility] flowsheets. BP 130/84   Pulse 85   Ht 165.1 cm (5' 5)   Wt (!) 123.4 kg (272 lb)   LMP 07/16/2023 (Exact Date)   BMI 45.26 kg/m  Gen: NAD  Pulm: No use of accessory muscles, normal respirations Abdomen: Gravid, nontender  Fundal height: 20 week  FHT by doppler (distinguished from mom): 140bpm  S=D Pelvic: SVE deferred Ext : no edema, no rashes Psych: Mood, insight, judgement intact  A/P:  33 y.o. [name] at [redacted]w[redacted]d   Elevated blood pressure versus chronic HTN  BP today 130/84 Checking BP's at home - reports consistent 130s/80s Previous c/section  Indication: arrest of dilation, CPD  Provider: [facility] Delivery preference: repeat c/s  Obesity: Pre-pregnant BMI 43.9 BMI today 45.23 Antepartum testing: Targeted US  with MFM at 18-20 weeks - completed Weekly NST starting 32-34 weeks  Monthly US  for growth/AFI in 3rd trimester  Delivery plans: BMI > 40 - IOL 40 - [redacted]w[redacted]d History of gestational HTN  Labs per obesity plan  Anxiety Meds: Zoloft  50 mg -> increased to 75 mg  Klonopin - occasionally for panic attacks  - Problem list reviewed and/or updated. - Return in  about 4 weeks (around 01/01/2024) for Routine OB visit with [facility].    Attestation Statement:   I personally performed the service, non-incident to. [facility])   JENNIFER RICHARDSON MYRON DELON MYRON, CNM 12/04/2023 9:06 AM

## 2023-12-20 ENCOUNTER — Encounter: Payer: Self-pay | Admitting: Obstetrics and Gynecology

## 2023-12-20 ENCOUNTER — Other Ambulatory Visit: Payer: Self-pay

## 2023-12-20 ENCOUNTER — Observation Stay
Admission: EM | Admit: 2023-12-20 | Discharge: 2023-12-21 | Disposition: A | Discharge status: Home / Self Care | Attending: Obstetrics and Gynecology | Admitting: Obstetrics and Gynecology

## 2023-12-20 DIAGNOSIS — O26892 Other specified pregnancy related conditions, second trimester: Secondary | ICD-10-CM | POA: Diagnosis present

## 2023-12-20 DIAGNOSIS — O133 Gestational [pregnancy-induced] hypertension without significant proteinuria, third trimester: Secondary | ICD-10-CM | POA: Diagnosis not present

## 2023-12-20 DIAGNOSIS — O99213 Obesity complicating pregnancy, third trimester: Secondary | ICD-10-CM | POA: Insufficient documentation

## 2023-12-20 DIAGNOSIS — Z3A22 22 weeks gestation of pregnancy: Secondary | ICD-10-CM | POA: Diagnosis not present

## 2023-12-20 DIAGNOSIS — M545 Low back pain, unspecified: Principal | ICD-10-CM | POA: Insufficient documentation

## 2023-12-20 DIAGNOSIS — E669 Obesity, unspecified: Secondary | ICD-10-CM | POA: Insufficient documentation

## 2023-12-20 LAB — URINALYSIS, COMPLETE (UACMP) WITH MICROSCOPIC
Bilirubin Urine: NEGATIVE
Glucose, UA: NEGATIVE mg/dL
Hgb urine dipstick: NEGATIVE
Ketones, ur: NEGATIVE mg/dL
Leukocytes,Ua: NEGATIVE
Nitrite: NEGATIVE
Protein, ur: NEGATIVE mg/dL
Specific Gravity, Urine: 1.023 (ref 1.005–1.030)
pH: 6 (ref 5.0–8.0)

## 2023-12-20 LAB — CBC
HCT: 30.5 % — ABNORMAL LOW (ref 36.0–46.0)
Hemoglobin: 10.5 g/dL — ABNORMAL LOW (ref 12.0–15.0)
MCH: 30.2 pg (ref 26.0–34.0)
MCHC: 34.4 g/dL (ref 30.0–36.0)
MCV: 87.6 fL (ref 80.0–100.0)
Platelets: 269 K/uL (ref 150–400)
RBC: 3.48 MIL/uL — ABNORMAL LOW (ref 3.87–5.11)
RDW: 13.2 % (ref 11.5–15.5)
WBC: 12.6 K/uL — ABNORMAL HIGH (ref 4.0–10.5)
nRBC: 0 % (ref 0.0–0.2)

## 2023-12-20 LAB — WET PREP, GENITAL
Clue Cells Wet Prep HPF POC: NONE SEEN
Sperm: NONE SEEN
Trich, Wet Prep: NONE SEEN
WBC, Wet Prep HPF POC: <10 (ref <10)
Yeast Wet Prep HPF POC: NONE SEEN

## 2023-12-20 MED ORDER — ACETAMINOPHEN 500 MG PO TABS
1000.0000 mg | ORAL_TABLET | Freq: Four times a day (QID) | ORAL | Status: DC | PRN
  Administered 2023-12-20: 1000 mg via ORAL
  Filled 2023-12-20: qty 2

## 2023-12-20 NOTE — Progress Notes (Signed)
 Urine specimen, wet prep performed and sent to lab

## 2023-12-20 NOTE — Discharge Summary (Incomplete)
 Patient ID: Molly Morton MRN: [id number] DOB/AGE: 22-Jul-1990 33 y.o.  Admit date: 12/20/2023 Discharge date: 12/21/2023  Admission Diagnoses: 33yo G2P1 at [redacted]w[redacted]d comes in for continued lower back pain.   Discharge Diagnoses: Lower back pain relieved with Flexeril   Factors complicating pregnancy: Elevated blood pressure versus chronic HTN  Previous c/section  Obesity: Pre-pregnant BMI 43.9 History of gestational HTN  Anxiety  Prenatal Procedures: NST  Consults: None  Significant Diagnostic Studies:  Results for orders placed or performed during the hospital encounter of 12/20/23 (from the past week)  Urinalysis, Complete w Microscopic -Urine, Clean Catch   Collection Time: 12/20/23  9:56 PM  Result Value Ref Range   Color, Urine YELLOW (A) YELLOW   APPearance HAZY (A) CLEAR   Specific Gravity, Urine 1.023 1.005 - 1.030   pH 6.0 5.0 - 8.0   Glucose, UA NEGATIVE NEGATIVE mg/dL   Hgb urine dipstick NEGATIVE NEGATIVE   Bilirubin Urine NEGATIVE NEGATIVE   Ketones, ur NEGATIVE NEGATIVE mg/dL   Protein, ur NEGATIVE NEGATIVE mg/dL   Nitrite NEGATIVE NEGATIVE   Leukocytes,Ua NEGATIVE NEGATIVE   RBC / HPF 0-5 0 - 5 RBC/hpf   WBC, UA 0-5 0 - 5 WBC/hpf   Bacteria, UA RARE (A) NONE SEEN   Squamous Epithelial / HPF 11-20 0 - 5 /HPF   Mucus PRESENT   Wet prep, genital   Collection Time: 12/20/23 10:02 PM   Specimen: Vaginal  Result Value Ref Range   Yeast Wet Prep HPF POC NONE SEEN NONE SEEN   Trich, Wet Prep NONE SEEN NONE SEEN   Clue Cells Wet Prep HPF POC NONE SEEN NONE SEEN   WBC, Wet Prep HPF POC <10 <10   Sperm NONE SEEN   CBC   Collection Time: 12/20/23 10:39 PM  Result Value Ref Range   WBC 12.6 (H) 4.0 - 10.5 K/uL   RBC 3.48 (L) 3.87 - 5.11 MIL/uL   Hemoglobin 10.5 (L) 12.0 - 15.0 g/dL   HCT 69.4 (L) 63.9 - 53.9 %   MCV 87.6 80.0 - 100.0 fL   MCH 30.2 26.0 - 34.0 pg   MCHC 34.4 30.0 - 36.0 g/dL   RDW 86.7 88.4 - 84.4 %   Platelets 269 150 - 400 K/uL   nRBC  0.0 0.0 - 0.2 %   US  RENAL Result Date: 12/21/2023 CLINICAL DATA:  100305.  Lower back pain. EXAM: RENAL / URINARY TRACT ULTRASOUND COMPLETE COMPARISON:  None. FINDINGS: Right Kidney: Renal measurements: 11.5 x 4.8 x 5.2 cm = volume: 148.4 mL. Echogenicity within normal limits. No mass, stones or hydronephrosis visualized. Left Kidney: Renal measurements: 11.7 x 5.3 x 5.4 cm = volume: 178.1 mL. Echogenicity within normal limits. No mass, stones or hydronephrosis visualized. Bladder: Appears normal for degree of bladder distention. It is partially contracted. Other: None. IMPRESSION: Negative renal ultrasound. Limited visualization of the bladder due to partial contraction. Electronically Signed   By: Francis Quam M.D.   On: 12/21/2023 01:21     Treatments: Tylenol , Flexeril  Hospital Course:  This is a 33 y.o. G2P1001 with IUP at [redacted]w[redacted]d seen for lower back pain.  She was given Tylenol  with some relief.  Lab results noted above with no significant findings. Renal u/s completed with no significant findings.  Flexeril was given and patient reported greatest pain relief after this medication. Overall, while she was observed, fetal heart rate monitoring remained reassuring, and she had no signs/symptoms of preterm labor or other maternal-fetal concerns.  Patient requested to go home where she felt she could rest better and would call if pain got worse.  She was deemed stable for discharge to home with outpatient follow up.  Discharge Physical Exam:  BP 133/63   Pulse 78   Temp 98.7 F (37.1 C) (Oral)   Resp 16   LMP 07/16/2023    Doppler appropriate for gestational age  TOCO: quiet SVE: deferred      Discharge Condition: Stable  Disposition: Discharge disposition: 01-Home or Self Care        Allergies as of 12/21/2023   No Known Allergies      Medication List     STOP taking these medications    ibuprofen  800 MG tablet Commonly known as: ADVIL    norethindrone  0.35 MG  tablet Commonly known as: Ortho Micronor    oxyCODONE  5 MG immediate release tablet Commonly known as: Oxy IR/ROXICODONE        TAKE these medications    cyclobenzaprine 10 MG tablet Commonly known as: FLEXERIL Take 1 tablet (10 mg total) by mouth 3 (three) times daily as needed for muscle spasms.   prenatal multivitamin Tabs tablet Take 1 tablet by mouth daily at 12 noon.   senna-docusate 8.6-50 MG tablet Commonly known as: Senokot-S Take 2 tablets by mouth daily.   sertraline  50 MG tablet Commonly known as: ZOLOFT  Take 50 mg by mouth daily.   simethicone  80 MG chewable tablet Commonly known as: MYLICON Chew 1 tablet (80 mg total) by mouth 3 (three) times daily after meals.         SignedBETHA DELON COE, CNM 12/21/2023 2:44 AM

## 2023-12-20 NOTE — Progress Notes (Signed)
Plan of care discussed with pt.

## 2023-12-21 ENCOUNTER — Observation Stay

## 2023-12-21 DIAGNOSIS — O26892 Other specified pregnancy related conditions, second trimester: Secondary | ICD-10-CM | POA: Diagnosis not present

## 2023-12-21 MED ORDER — CYCLOBENZAPRINE HCL 5 MG PO TABS
10.0000 mg | ORAL_TABLET | Freq: Once | ORAL | Status: AC
  Administered 2023-12-21: 10 mg via ORAL
  Filled 2023-12-21: qty 2

## 2023-12-21 MED ORDER — CYCLOBENZAPRINE HCL 10 MG PO TABS: 10.0000 mg | ORAL_TABLET | Freq: Three times a day (TID) | ORAL | 0 refills | Status: DC | PRN

## 2023-12-21 NOTE — OB Triage Note (Signed)
.  Molly Morton is a 33 y.o. at [redacted]w[redacted]d here in [facility] labor and delivery triage reporting: lower back pain that increased in severity today and remains constant. Pt states she also has some increase in clear discharge as well. Pt complains of mild nausea today but feels that is related to a new antibiotic that she started yesterday for a sinus infection . Back pain has not been relieved by tylenol  prior to arrival at the hospital. Pt Is feeling the baby move and denies any LOF or bleeding.   Onset of complaint: 12/20/2023 in the am Pain score: 9 Vitals:   12/20/23 2115  BP: 135/66  Pulse: 92  Temp: 98.9 F (37.2 C)     FHT:160 (doppler) Lab orders placed from triage:  CBC, wet prep, and UA.

## 2024-01-29 LAB — OB RESULTS CONSOLE HIV ANTIBODY (ROUTINE TESTING): HIV: NONREACTIVE

## 2024-01-30 ENCOUNTER — Inpatient Hospital Stay (HOSPITAL_COMMUNITY): Admission: AD | Admit: 2024-01-30 | Source: Home / Self Care | Admitting: Obstetrics & Gynecology

## 2024-02-13 NOTE — Progress Notes (Signed)
 Patient's last menstrual period was 07/16/2023 (exact date). Estimated Date of Delivery: 04/21/24  33 y.o. [name] at [redacted]w[redacted]d  The primary encounter diagnosis was Supervision of high risk pregnancy in third trimester (HHS-HCC). Diagnoses of Obesity in pregnancy, antepartum (HHS-HCC), Anxiety in pregnancy, antepartum (HHS-HCC), Previous cesarean section, Elevated blood pressure complicating pregnancy, antepartum (HHS-HCC), Echogenic intracardiac focus of fetus on prenatal ultrasound, and Breech presentation with antenatal problem, single or unspecified fetus (HHS-HCC) were also pertinent to this visit.  S:   Patient concerns today:  - has a growth US  scheduled with MFM d/t an EIF being seen on her previous US . MFM reached out to her and told her because the EIF was the only thing seen of concern at her last visit, she can talk to us  about having another growth US  in our clinic and if the EIF is still seen, then follow-up with MFM. She would prefer to wait until next growth US  to determine if she needs MFM.   Chief Complaint  Patient presents with  . Routine Prenatal Visit    Reports: good fetal movement  Denies bleeding, contractions, cramping or leaking.   O:   See [facility] flowsheets. BP 135/84   Pulse 88   Wt (!) 130.5 kg (287 lb 12.8 oz)   LMP 07/16/2023 (Exact Date)   BMI 47.89 kg/m  Gen: NAD  Pulm: No use of accessory muscles, normal respirations Abdomen: Gravid, nontender  Fundal height: 34cm  FHT by doppler (distinguished from mom): 150bpm  S>D Pelvic: SVE deferred  Ext : mild edema, no rashes Psych: Mood, insight, judgement intact  A/P:  33 y.o. [name] at [redacted]w[redacted]d   - Problem list reviewed and/or updated.  1. Supervision of high risk pregnancy in third trimester (HHS-HCC)  Kick counts reviewed with the patient in detail.  Patient instructed to assess for fetal activity daily at regular intervals.  Counts to be done if decreased activity noted.  Patient instructed to  contact the office if counts do not reveal adequate movements. Preterm labor precautions (lower back pain, ctx, ROM, vaginal bleeding) reviewed.  How and when to call reviewed.  Pt verbalized understanding.  2. Obesity in pregnancy, antepartum (HHS-HCC) TWG: 14.9 kg (32 lb 12.8 oz) Body mass index is 47.89 kg/m. Needs weekly BPP starting at 32wks  3. Anxiety in pregnancy, antepartum (HHS-HCC) Taking Zoloft  50mg  daily  4. Previous cesarean section Planning repeat cesarean section with Dr. Leonce  5. Elevated blood pressure complicating pregnancy, antepartum (HHS-HCC) BP today 135/84  6. Echogenic intracardiac focus of fetus on prenatal ultrasound Growth US  follow-up in December If EIF if still seen at next growth US , go to MFM for follow-up US     FKC reviewed.  Pre-term labor precautions reviewed.   Return in about 2 weeks (around 02/27/2024) for routine Prenatal, growth monthly starting at 28 weeks, BPP weekly starting at 32 weeks.   I personally performed the service, non-incident to. [facility])   EDSEL CHARLIES BLUSH , CNM 02/13/2024 10:43 AM

## 2024-02-23 ENCOUNTER — Other Ambulatory Visit: Payer: Self-pay

## 2024-02-23 ENCOUNTER — Observation Stay
Admission: EM | Admit: 2024-02-23 | Discharge: 2024-02-23 | Disposition: A | Discharge status: Home / Self Care | Attending: Certified Nurse Midwife | Admitting: Obstetrics and Gynecology

## 2024-02-23 ENCOUNTER — Encounter: Payer: Self-pay | Admitting: Obstetrics and Gynecology

## 2024-02-23 DIAGNOSIS — O99213 Obesity complicating pregnancy, third trimester: Secondary | ICD-10-CM | POA: Insufficient documentation

## 2024-02-23 DIAGNOSIS — E6689 Other obesity not elsewhere classified: Secondary | ICD-10-CM | POA: Insufficient documentation

## 2024-02-23 DIAGNOSIS — Z3A31 31 weeks gestation of pregnancy: Secondary | ICD-10-CM | POA: Insufficient documentation

## 2024-02-23 DIAGNOSIS — O34219 Maternal care for unspecified type scar from previous cesarean delivery: Secondary | ICD-10-CM | POA: Insufficient documentation

## 2024-02-23 DIAGNOSIS — O26899 Other specified pregnancy related conditions, unspecified trimester: Principal | ICD-10-CM | POA: Diagnosis present

## 2024-02-23 DIAGNOSIS — F419 Anxiety disorder, unspecified: Secondary | ICD-10-CM | POA: Insufficient documentation

## 2024-02-23 DIAGNOSIS — Z6841 Body Mass Index (BMI) 40.0 and over, adult: Secondary | ICD-10-CM | POA: Insufficient documentation

## 2024-02-23 DIAGNOSIS — O99343 Other mental disorders complicating pregnancy, third trimester: Secondary | ICD-10-CM | POA: Insufficient documentation

## 2024-02-23 DIAGNOSIS — O10913 Unspecified pre-existing hypertension complicating pregnancy, third trimester: Principal | ICD-10-CM | POA: Insufficient documentation

## 2024-02-23 LAB — COMPREHENSIVE METABOLIC PANEL WITH GFR
ALT: 6 U/L (ref 0–44)
AST: 14 U/L — ABNORMAL LOW (ref 15–41)
Albumin: 3.4 g/dL — ABNORMAL LOW (ref 3.5–5.0)
Alkaline Phosphatase: 69 U/L (ref 38–126)
Anion gap: 12 (ref 5–15)
BUN: 7 mg/dL (ref 6–20)
CO2: 21 mmol/L — ABNORMAL LOW (ref 22–32)
Calcium: 8.8 mg/dL — ABNORMAL LOW (ref 8.9–10.3)
Chloride: 104 mmol/L (ref 98–111)
Creatinine, Ser: 0.55 mg/dL (ref 0.44–1.00)
GFR, Estimated: >60 mL/min (ref >60)
Glucose, Bld: 94 mg/dL (ref 70–99)
Potassium: 3.8 mmol/L (ref 3.5–5.1)
Sodium: 137 mmol/L (ref 135–145)
Total Bilirubin: 0.5 mg/dL (ref 0.0–1.2)
Total Protein: 6.2 g/dL — ABNORMAL LOW (ref 6.5–8.1)

## 2024-02-23 LAB — CBC WITH DIFFERENTIAL/PLATELET
Abs Immature Granulocytes: 0.21 K/uL — ABNORMAL HIGH (ref 0.00–0.07)
Basophils Absolute: 0 K/uL (ref 0.0–0.1)
Basophils Relative: 0 %
Eosinophils Absolute: 0 K/uL (ref 0.0–0.5)
Eosinophils Relative: 0 %
HCT: 29.8 % — ABNORMAL LOW (ref 36.0–46.0)
Hemoglobin: 10.2 g/dL — ABNORMAL LOW (ref 12.0–15.0)
Immature Granulocytes: 2 %
Lymphocytes Relative: 15 %
Lymphs Abs: 2 K/uL (ref 0.7–4.0)
MCH: 29.3 pg (ref 26.0–34.0)
MCHC: 34.2 g/dL (ref 30.0–36.0)
MCV: 85.6 fL (ref 80.0–100.0)
Monocytes Absolute: 0.8 K/uL (ref 0.1–1.0)
Monocytes Relative: 6 %
Neutro Abs: 9.8 K/uL — ABNORMAL HIGH (ref 1.7–7.7)
Neutrophils Relative %: 77 %
Platelets: 249 K/uL (ref 150–400)
RBC: 3.48 MIL/uL — ABNORMAL LOW (ref 3.87–5.11)
RDW: 13.4 % (ref 11.5–15.5)
WBC: 12.9 K/uL — ABNORMAL HIGH (ref 4.0–10.5)
nRBC: 0 % (ref 0.0–0.2)

## 2024-02-23 LAB — PROTEIN / CREATININE RATIO, URINE
Creatinine, Urine: 243 mg/dL
Protein Creatinine Ratio: 0.08 mg/mg{creat} (ref 0.00–0.15)
Total Protein, Urine: 19 mg/dL

## 2024-02-23 MED ORDER — ACETAMINOPHEN 325 MG PO TABS: 650.0000 mg | ORAL_TABLET | ORAL | Status: DC | PRN

## 2024-02-23 MED ORDER — CALCIUM CARBONATE ANTACID 500 MG PO CHEW: 2.0000 | CHEWABLE_TABLET | ORAL | Status: DC | PRN

## 2024-02-23 MED ORDER — ZOLPIDEM TARTRATE 5 MG PO TABS: 5.0000 mg | ORAL_TABLET | Freq: Every evening | ORAL | Status: DC | PRN

## 2024-02-23 NOTE — OB Triage Note (Signed)

## 2024-02-23 NOTE — OB Triage Note (Signed)
 Pt reports to labor and delivery with concerns regarding her BP. She states that this morning she woke up and felt dizzy like the room was spinning. She said that she can usually tell when her BP is elevated and she felt like that is what happened. She wasn't able to take her BP at home because her cuff wasn't working. She denies epigastric pain and blurred vision.  Denies headache.   Denies vaginal bleeding, LOF, and contractions. States positive fetal movement.   EFM and toco applied and assessing.

## 2024-02-23 NOTE — Discharge Summary (Signed)
 Molly Morton is a 33 y.o. female. She is at [redacted]w[redacted]d gestation. Patient's last menstrual period was 07/16/2023. Estimated Date of Delivery: 04/21/24  [facility]renatal care site: [facility]   Current pregnancy complicated by:  Elevated blood pressure versus chronic HTN  Previous c/section  Obesity: Pre-pregnant BMI 43.9 History of gestational HTN  Anxiety  Chief complaint: headache, history of elevated BP  She reports today she experienced a headache and dizziness and it felt similar to when she developed gestational HTN with her previous pregnancy. She does not have  working BP cuff at home so came in for BP monitoring. She reports her headache resolved with Tylenol  500mg . The dizziness resolved this morning.  S: Resting comfortably. no CTX, no VB.no LOF,  Active fetal movement.  Denies: HA, visual changes, SOB, or RUQ/epigastric pain  Maternal Medical History:   Past Medical History:  Diagnosis Date   Anxiety     Past Surgical History:  Procedure Laterality Date   BREAST SURGERY     CESAREAN SECTION N/A 08/21/2018   Procedure: CESAREAN SECTION;  Surgeon: Schermerhorn, Debby PARAS, MD;  Location: [facility] ORS;  Service: Obstetrics;  Laterality: N/A;   WISDOM TOOTH EXTRACTION      No Known Allergies  Prior to Admission medications   Medication Sig Start Date End Date Taking? Authorizing Provider  cyclobenzaprine  (FLEXERIL ) 10 MG tablet Take 1 tablet (10 mg total) by mouth 3 (three) times daily as needed for muscle spasms. Patient not taking: Reported on 02/23/2024 12/21/23   Myron Nest, CNM  Prenatal Vit-Fe Fumarate-FA (PRENATAL MULTIVITAMIN) TABS tablet Take 1 tablet by mouth daily at 12 noon.    [provider]  senna-docusate (SENOKOT-S) 8.6-50 MG tablet Take 2 tablets by mouth daily. Patient not taking: Reported on 02/23/2024 08/25/18   McVey, [place]berry LABOR, CNM  sertraline  (ZOLOFT ) 50 MG tablet Take 50 mg by mouth daily.    [provider]  simethicone   (MYLICON) 80 MG chewable tablet Chew 1 tablet (80 mg total) by mouth 3 (three) times daily after meals. Patient not taking: Reported on 02/23/2024 08/24/18   McVey, [place]berry LABOR, CNM    Social History: She  reports that she has never smoked. She has never used smokeless tobacco. She reports that she does not drink alcohol and does not use drugs.  Family History: family history includes Diabetes in her father; Heart Problems in her mother.  no history of gyn cancers  Review of Systems: A full review of systems was performed and negative except as noted in the HPI.     O:  BP (!) 118/54   Pulse 81   Temp 98.2 F (36.8 C) (Oral)   Resp 16   Ht 5' 6 (1.676 m)   LMP 07/16/2023   BMI 39.54 kg/m  Vitals:   02/23/24 1611 02/23/24 1615 02/23/24 1635 02/23/24 1650  BP: (!) 122/54 (!) 122/54 (!) 118/54 (!) 116/57   02/23/24 1705 02/23/24 1720 02/23/24 1735  BP: 136/71 (!) 119/57 (!) 118/54    Results for orders placed or performed during the hospital encounter of 02/23/24 (from the past 48 hours)  CBC with Differential/Platelet   Collection Time: 02/23/24  4:28 PM  Result Value Ref Range   WBC 12.9 (H) 4.0 - 10.5 K/uL   RBC 3.48 (L) 3.87 - 5.11 MIL/uL   Hemoglobin 10.2 (L) 12.0 - 15.0 g/dL   HCT 70.1 (L) 63.9 - 53.9 %   MCV 85.6 80.0 - 100.0 fL   MCH 29.3  26.0 - 34.0 pg   MCHC 34.2 30.0 - 36.0 g/dL   RDW 86.5 88.4 - 84.4 %   Platelets 249 150 - 400 K/uL   nRBC 0.0 0.0 - 0.2 %   Neutrophils Relative % 77 %   Neutro Abs 9.8 (H) 1.7 - 7.7 K/uL   Lymphocytes Relative 15 %   Lymphs Abs 2.0 0.7 - 4.0 K/uL   Monocytes Relative 6 %   Monocytes Absolute 0.8 0.1 - 1.0 K/uL   Eosinophils Relative 0 %   Eosinophils Absolute 0.0 0.0 - 0.5 K/uL   Basophils Relative 0 %   Basophils Absolute 0.0 0.0 - 0.1 K/uL   Immature Granulocytes 2 %   Abs Immature Granulocytes 0.21 (H) 0.00 - 0.07 K/uL  Comprehensive metabolic panel   Collection Time: 02/23/24  4:28 PM  Result Value Ref Range   Sodium  137 135 - 145 mmol/L   Potassium 3.8 3.5 - 5.1 mmol/L   Chloride 104 98 - 111 mmol/L   CO2 21 (L) 22 - 32 mmol/L   Glucose, Bld 94 70 - 99 mg/dL   BUN 7 6 - 20 mg/dL   Creatinine, Ser 9.44 0.44 - 1.00 mg/dL   Calcium  8.8 (L) 8.9 - 10.3 mg/dL   Total Protein 6.2 (L) 6.5 - 8.1 g/dL   Albumin 3.4 (L) 3.5 - 5.0 g/dL   AST 14 (L) 15 - 41 U/L   ALT 6 0 - 44 U/L   Alkaline Phosphatase 69 38 - 126 U/L   Total Bilirubin 0.5 0.0 - 1.2 mg/dL   GFR, Estimated >39 >39 mL/min   Anion gap 12 5 - 15  Protein / creatinine ratio, urine   Collection Time: 02/23/24  4:28 PM  Result Value Ref Range   Creatinine, Urine 243 mg/dL   Total Protein, Urine 19 mg/dL   Protein Creatinine Ratio 0.08 0.00 - 0.15 mg/mg[Cre]     Constitutional: NAD, AAOx3  HE/ENT: extraocular movements grossly intact, moist mucous membranes CV: RRR PULM: nl respiratory effort, CTABL     Abd: gravid, non-tender, non-distended, soft      Ext: Non-tender, Nonedematous   Psych: mood appropriate, speech normal Pelvic: deferred  Fetal  monitoring: Cat 1 Appropriate for GA Baseline: 145bpm Variability: moderate Accelerations: present x >2 Decelerations absent Time 2 hours  A/P: 33 y.o. [redacted]w[redacted]d here for antenatal surveillance for headache and history of gHTN  Principle Diagnosis:  Headache, history of gHTN in pregnancy, normotensive  Pre-eclampsia/elevated BP: not present. BP WNL, labs WNL. Reviewed PIH precautions. Fetal Wellbeing: Reassuring Cat 1 tracing. D/c home stable, precautions reviewed, follow-up as scheduled.    Edsel Charlies Blush, CNM 02/23/2024 5:40 PM

## 2024-02-23 NOTE — Discharge Instructions (Signed)
 PRETERM LABOR: Includes any of the following symptoms that occur between 20-[redacted] weeks gestation. If these symptoms are not stopped, preterm labor can result in preterm delivery, placing your baby at risk.  Notify your doctor if any of the following occur: 1. Menstrual-like cramps   5. Pelvic pressure  2. Uterine contractions. These may be painless and feel like the uterus is tightening or the baby is balling up 6. Increase or change in vaginal discharge  3. Low, dull backache, unrelieved by heat or Tylenol   7. Vaginal bleeding  4. Intestinal cramps, with our without diarrhea, sometimes 8. A general feeling that something is not right   9. Leaking of fluid described as gas pain

## 2024-03-26 LAB — OB RESULTS CONSOLE GBS: GBS: NEGATIVE

## 2024-03-26 NOTE — H&P (Signed)
 OB History & Physical   History of Present Illness:  Chief Complaint: Here for c-section  HPI:  Molly Morton is a 34 y.o. G62P1011 female at [redacted]w[redacted]d dated by LMP consistent with an 8-week ultrasound.  Her pregnancy has been complicated by obesity with a BMI greater than 40, history of C-section, history of gestational hypertension.    She denies contractions.   She denies leakage of fluid.   She denies vaginal bleeding.   She reports fetal movement.    Total weight gain for pregnancy: 17.7 kg (39 lb)      Maternal Medical History:   Past Medical History:  Diagnosis Date   Anxiety    History of eczema    Migraine headache    Osteoarthritis 02/19/2016    Past Surgical History:  Procedure Laterality Date   CESAREAN SECTION  08/21/2018   AUGMENTATION MAMMAPLASTY      No Known Allergies  Prior to Admission medications  Medication Sig Taking? Last Dose  clonazePAM (KLONOPIN) 1 MG tablet Take 1 tablet (1 mg total) by mouth 2 (two) times daily as needed for Anxiety Yes PRN Not Currently Taking  prenatal vitamin-iron-FA (PRENATE PLUS) tablet Take 1 tablet by mouth once daily Yes Taking  sertraline  (ZOLOFT ) 50 MG tablet Take 1.5 tablets (75 mg total) by mouth once daily Yes Taking  cyclobenzaprine  (FLEXERIL ) 10 MG tablet Take 10 mg by mouth 3 (three) times daily as needed for Muscle spasms Patient not taking: Reported on 03/26/2024  Not Taking    OB History  Gravida Para Term Preterm AB Living  3 1 1  0 1 1  SAB IAB Ectopic Molar Multiple Live Births  1 0 0   1    # Outcome Date GA Lbr Len/2nd Weight Sex Type Anes PTL Lv  3 Current           2 Term 08/21/18 [redacted]w[redacted]d  3.24 kg (7 lb 2.3 oz) F CS-LTranv EPI, Spinal  LIV     Complications: Arrested active phase of labor (HHS-HCC), Cephalopelvic disproportion (HHS-HCC)  1 SAB             Prenatal care site: [facility]  Social History: She  reports that she has never smoked. She has never used smokeless tobacco. She  reports that she does not drink alcohol and does not use drugs.  Family History: family history includes Cancer in her father; Colon cancer in her maternal grandfather; Diabetes in her father and maternal grandmother; Heart disease in her mother; High blood pressure (Hypertension) in her mother; Pancreatic cancer in her father; Stroke in her maternal grandmother and maternal uncle; Thyroid  disease in her sister.   Review of Systems  Constitutional: Negative.   HENT: Negative.    Eyes: Negative.   Respiratory: Negative.    Cardiovascular: Negative.   Gastrointestinal: Negative.   Genitourinary: Negative.   Musculoskeletal: Negative.   Skin: Negative.   Neurological: Negative.   Endo/Heme/Allergies: Negative.   Psychiatric/Behavioral: Negative.       Physical Exam:  BP 131/85   Pulse 88   Ht 165.1 cm (5' 5)   Wt (!) 133.4 kg (294 lb)   LMP 07/16/2023 (Exact Date)   BMI 48.92 kg/m   Physical Exam Constitutional:      General: She is not in acute distress.    Appearance: Normal appearance.  HENT:     Head: Normocephalic and atraumatic.  Eyes:     General: No scleral icterus.    Conjunctiva/sclera: Conjunctivae normal.  Cardiovascular:     Rate and Rhythm: Normal rate and regular rhythm.     Heart sounds: No murmur heard.    No friction rub. No gallop.  Pulmonary:     Effort: Pulmonary effort is normal. No respiratory distress.     Breath sounds: Normal breath sounds. No wheezing, rhonchi or rales.  Abdominal:     General: Bowel sounds are normal. There is no distension.     Palpations: Abdomen is soft. There is mass (gravid, NT).     Tenderness: There is no abdominal tenderness. There is no guarding or rebound.  Musculoskeletal:        General: No swelling. Normal range of motion.  Neurological:     General: No focal deficit present.     Mental Status: She is oriented to person, place, and time.     Cranial Nerves: No cranial nerve deficit.  Skin:    General: Skin  is warm and dry.     Findings: No lesion.  Psychiatric:        Mood and Affect: Mood normal.        Behavior: Behavior normal.        Judgment: Judgment normal.  Vitals and nursing note reviewed.      Pertinent Results:  Prenatal Labs Blood type/Rh O positive  Antibody screen negative  Rubella Immune  Varicella Immune    RPR NR (10/04/2023, 01/29/2024)  HBsAg negative  Hep C negative  HIV Negative (10/04/2023, 01/29/2024)  GC negative  Chlamydia negative  Genetic screening Diploid XX [place])  1 hour GTT 172  3 hour GTT (85, 191, 114, 36)  GBS pending on 03/26/2024    Assessment:  Molly Morton is a 34 y.o. G46P1011 female at [redacted]w[redacted]d with history of cesarean delivery, desires repeat.   Plan:  Admit to Labor & Delivery  CBC, T&S, NPO, IVF GBS pending.   Consents reviewed and signed today Declined OnQ pump   GARNETTE TORIBIO MACE, MD 03/26/2024 10:06 AM

## 2024-03-26 NOTE — H&P (Deleted)
   The note originally documented on this encounter has been moved the the encounter in which it belongs.

## 2024-04-12 ENCOUNTER — Encounter
Admission: RE | Admit: 2024-04-12 | Discharge: 2024-04-12 | Disposition: A | Discharge status: Home / Self Care | Source: Ambulatory Visit | Attending: Obstetrics and Gynecology | Admitting: Obstetrics and Gynecology

## 2024-04-12 ENCOUNTER — Encounter
Admission: RE | Admit: 2024-04-12 | Discharge: 2024-04-12 | Disposition: A | Source: Ambulatory Visit | Attending: Obstetrics and Gynecology | Admitting: Obstetrics and Gynecology

## 2024-04-12 ENCOUNTER — Other Ambulatory Visit: Payer: Self-pay

## 2024-04-12 DIAGNOSIS — Z98891 History of uterine scar from previous surgery: Secondary | ICD-10-CM | POA: Insufficient documentation

## 2024-04-12 DIAGNOSIS — Z01812 Encounter for preprocedural laboratory examination: Secondary | ICD-10-CM | POA: Insufficient documentation

## 2024-04-12 HISTORY — DX: Gastro-esophageal reflux disease without esophagitis: K21.9

## 2024-04-12 HISTORY — DX: Nausea with vomiting, unspecified: R11.2

## 2024-04-12 LAB — CBC
HCT: 33.5 % — ABNORMAL LOW (ref 36.0–46.0)
Hemoglobin: 11.3 g/dL — ABNORMAL LOW (ref 12.0–15.0)
MCH: 28.6 pg (ref 26.0–34.0)
MCHC: 33.7 g/dL (ref 30.0–36.0)
MCV: 84.8 fL (ref 80.0–100.0)
Platelets: 241 K/uL (ref 150–400)
RBC: 3.95 MIL/uL (ref 3.87–5.11)
RDW: 14 % (ref 11.5–15.5)
WBC: 10.9 K/uL — ABNORMAL HIGH (ref 4.0–10.5)
nRBC: 0 % (ref 0.0–0.2)

## 2024-04-12 LAB — TYPE AND SCREEN
ABO/RH(D): O POS
Antibody Screen: NEGATIVE
Extend sample reason: UNDETERMINED

## 2024-04-12 NOTE — Patient Instructions (Addendum)
 Your procedure is scheduled on:04-15-24 Monday  Arrival Time: 5:30 AM. Please call Labor and Delivery if you have any questions. [phone].   Arrival: If your arrival time is prior to 6:00 am, please enter through the Emergency Room Entrance and you will be directed to Labor and Delivery. If your arrival time is 6:00 am or later, please enter the Medical Mall and follow the greeter's instructions.  REMEMBER: Instructions that are not followed completely may result in serious medical risk, up to and including death; or upon the discretion of your surgeon and anesthesiologist your surgery may need to be rescheduled.  Do not eat food OR drink liquids after midnight the night before surgery.  No gum chewing or hard candies.  One week prior to surgery: Stop Anti-inflammatories (NSAIDS) such as Advil , Aleve, Ibuprofen , Motrin , Naproxen, Naprosyn and Aspirin based products such as Excedrin, Goody's Powder, BC Powder.  You may however, continue to take Tylenol  if needed for pain up until the day of surgery.  Continue taking all of your other prescription medications up until the day of surgery.  ON THE DAY OF SURGERY ONLY TAKE THESE MEDICATIONS WITH SIPS OF WATER: -You may take clonazePAM  (KLONOPIN ) if needed for anxiety  No Alcohol for 24 hours before or after surgery.  No Smoking including e-cigarettes for 24 hours prior to surgery.  No chewable tobacco products for at least 6 hours prior to surgery.  No nicotine patches on the day of surgery.  Do not use any recreational drugs for at least a week prior to your surgery.  Please be advised that the combination of cocaine and anesthesia may have negative outcomes, up to and including death. If you test positive for cocaine, your surgery will be cancelled.  On the morning of surgery brush your teeth with toothpaste and water, you may rinse your mouth with mouthwash if you wish. Do not swallow any toothpaste or mouthwash.  Use CHG soap  as directed on instruction sheet (Avoid your Nipple and Private area)  Do not wear jewelry, make-up, hairpins, clips or nail polish.  For welded (permanent) jewelry: bracelets, anklets, waist bands, etc.  Please have this removed prior to surgery.  If it is not removed, there is a chance that hospital personnel will need to cut it off on the day of surgery.  Do not wear lotions, powders, or perfumes.   Do not shave body hair from the neck down 48 hours before surgery.  Contact lenses, hearing aids and dentures may not be worn into surgery.  Do not bring valuables to the hospital. [facility] is not responsible for any missing/lost belongings or valuables.   Notify your doctor if there is any change in your medical condition (cold, fever, infection).  Wear comfortable clothing (specific to your surgery type) to the hospital.  After surgery, you can help prevent lung complications by doing breathing exercises.  Take deep breaths and cough every 1-2 hours. Your doctor may order a device called an Incentive Spirometer to help you take deep breaths. When coughing or sneezing, hold a pillow firmly against your incision with both hands. This is called splinting. Doing this helps protect your incision. It also decreases belly discomfort.  Please call the Pre-admissions Testing Dept. at [phone] if you have any questions about these instructions.  Surgery Visitation Policy:  Visitor Passes   All visitors, including children, need an identification sticker when visiting. These stickers must be worn where they can be seen.   Labor &  Delivery  Laboring women may have one designated support person and two other visitors of any age visit. The support person must remain the same. The visitors may switch with other visitors. Visitation is permitted 24 hours per day. The designated support person or a visitor over the age of 16 may sleep overnight in the patient's room. A doula registered  with [facility] for labor and delivery support is not considered a visitor. Doulas not registered with [facility] are considered visitors.  Mother Baby Unit, OB Specialty and Gynecological Care  A designated support person and three visitors may visit. The three visitors may switch out. The designated support person or a visitor age 88 or older may stay overnight in the room. During the postpartum period (up to 6 weeks), if the mother is the patient, she can have her newborn stay with her if there is another support person present who can be responsible for the baby.                                                                                                               Preparing for Surgery with CHLORHEXIDINE  GLUCONATE (CHG) Soap  Chlorhexidine  Gluconate (CHG) Soap  o An antiseptic cleaner that kills germs and bonds with the skin to continue killing germs even after washing  o Used for showering the night before surgery and morning of surgery  Before surgery, you can play an important role by reducing the number of germs on your skin.  CHG (Chlorhexidine  gluconate) soap is an antiseptic cleanser which kills germs and bonds with the skin to continue killing germs even after washing.  Please do not use if you have an allergy to CHG or antibacterial soaps. If your skin becomes reddened/irritated stop using the CHG.  1. Shower the NIGHT BEFORE SURGERY with CHG soap.  2. If you choose to wash your hair, wash your hair first as usual with your normal shampoo.  3. After shampooing, rinse your hair and body thoroughly to remove the shampoo.  4. Use CHG as you would any other liquid soap. You can apply CHG directly to the skin and wash gently with a clean washcloth.  5. Apply the CHG soap to your body only from the neck down. Do not use on open wounds or open sores. Avoid contact with your eyes, ears, mouth, and genitals (private parts). Wash face and genitals (private parts) and  nipple areas with your normal soap.  6. Wash thoroughly, paying special attention to the area where your surgery will be performed.  7. Thoroughly rinse your body with warm water.  8. Do not shower/wash with your normal soap after using and rinsing off the CHG soap.  9. Do not use lotions, oils, etc., after showering with CHG.  10. Pat yourself dry with a clean towel.  11. Wear clean pajamas to bed the night before surgery.  12. Place clean sheets on your bed the night of your shower and do not sleep with pets.  13. Do not apply any  deodorants/lotions/powders.  14. Please wear clean clothes to the hospital.  15. Remember to brush your teeth with your regular toothpaste.

## 2024-04-13 LAB — SYPHILIS: RPR W/REFLEX TO RPR TITER AND TREPONEMAL ANTIBODIES, TRADITIONAL SCREENING AND DIAGNOSIS ALGORITHM: RPR Ser Ql: NONREACTIVE

## 2024-04-15 ENCOUNTER — Inpatient Hospital Stay: Admitting: Certified Registered Nurse Anesthetist

## 2024-04-15 ENCOUNTER — Encounter: Admission: RE | Payer: Self-pay | Source: Ambulatory Visit

## 2024-04-15 ENCOUNTER — Encounter: Payer: Self-pay | Admitting: Obstetrics and Gynecology

## 2024-04-15 ENCOUNTER — Encounter: Admission: EM | Disposition: A | Payer: Self-pay | Source: Home / Self Care | Attending: Obstetrics and Gynecology

## 2024-04-15 ENCOUNTER — Inpatient Hospital Stay: Admission: RE | Admit: 2024-04-15 | Source: Ambulatory Visit | Admitting: Obstetrics and Gynecology

## 2024-04-15 ENCOUNTER — Inpatient Hospital Stay
Admission: EM | Admit: 2024-04-15 | Discharge: 2024-04-17 | DRG: 788 | Disposition: A | Attending: Obstetrics and Gynecology | Admitting: Obstetrics and Gynecology

## 2024-04-15 ENCOUNTER — Other Ambulatory Visit: Payer: Self-pay

## 2024-04-15 DIAGNOSIS — Z98891 History of uterine scar from previous surgery: Principal | ICD-10-CM

## 2024-04-15 DIAGNOSIS — K219 Gastro-esophageal reflux disease without esophagitis: Secondary | ICD-10-CM | POA: Diagnosis present

## 2024-04-15 DIAGNOSIS — O99214 Obesity complicating childbirth: Secondary | ICD-10-CM | POA: Diagnosis present

## 2024-04-15 DIAGNOSIS — Z833 Family history of diabetes mellitus: Secondary | ICD-10-CM

## 2024-04-15 DIAGNOSIS — E66813 Obesity, class 3: Secondary | ICD-10-CM | POA: Diagnosis present

## 2024-04-15 DIAGNOSIS — Z8249 Family history of ischemic heart disease and other diseases of the circulatory system: Secondary | ICD-10-CM

## 2024-04-15 DIAGNOSIS — O34211 Maternal care for low transverse scar from previous cesarean delivery: Principal | ICD-10-CM | POA: Diagnosis present

## 2024-04-15 DIAGNOSIS — Z3A39 39 weeks gestation of pregnancy: Secondary | ICD-10-CM

## 2024-04-15 DIAGNOSIS — O9962 Diseases of the digestive system complicating childbirth: Secondary | ICD-10-CM | POA: Diagnosis present

## 2024-04-15 MED ORDER — SIMETHICONE 80 MG PO CHEW
80.0000 mg | CHEWABLE_TABLET | Freq: Three times a day (TID) | ORAL | Status: DC
  Administered 2024-04-15 – 2024-04-17 (×6): 80 mg via ORAL
  Filled 2024-04-15 (×6): qty 1

## 2024-04-15 MED ORDER — LACTATED RINGERS IV BOLUS
1000.0000 mL | Freq: Once | INTRAVENOUS | Status: AC
  Administered 2024-04-15: 1000 mL via INTRAVENOUS

## 2024-04-15 MED ORDER — SERTRALINE HCL 25 MG PO TABS
50.0000 mg | ORAL_TABLET | Freq: Every day | ORAL | Status: DC
  Filled 2024-04-15: qty 1

## 2024-04-15 MED ORDER — DIBUCAINE (PERIANAL) 1 % EX OINT: 1.0000 | TOPICAL_OINTMENT | CUTANEOUS | Status: DC | PRN

## 2024-04-15 MED ORDER — BUPIVACAINE HCL 0.25 % IJ SOLN
INTRAMUSCULAR | Status: DC | PRN
  Administered 2024-04-15: 30 mL

## 2024-04-15 MED ORDER — KETOROLAC TROMETHAMINE 30 MG/ML IJ SOLN
INTRAMUSCULAR | Status: DC | PRN
  Administered 2024-04-15: 30 mg via INTRAVENOUS

## 2024-04-15 MED ORDER — NALOXONE HCL 4 MG/10ML IJ SOLN: 1.0000 ug/kg/h | INTRAVENOUS | Status: DC | PRN

## 2024-04-15 MED ORDER — PHENYLEPHRINE HCL-NACL 20-0.9 MG/250ML-% IV SOLN
INTRAVENOUS | Status: AC
  Filled 2024-04-15: qty 250

## 2024-04-15 MED ORDER — SIMETHICONE 80 MG PO CHEW: 80.0000 mg | CHEWABLE_TABLET | ORAL | Status: DC | PRN

## 2024-04-15 MED ORDER — KETOROLAC TROMETHAMINE 30 MG/ML IJ SOLN: 30.0000 mg | Freq: Four times a day (QID) | INTRAMUSCULAR | Status: AC | PRN

## 2024-04-15 MED ORDER — ORAL CARE MOUTH RINSE: 15.0000 mL | Freq: Once | OROMUCOSAL | Status: AC

## 2024-04-15 MED ORDER — OXYTOCIN-SODIUM CHLORIDE 30-0.9 UT/500ML-% IV SOLN
2.5000 [IU]/h | INTRAVENOUS | Status: AC
  Administered 2024-04-15: 2.5 [IU]/h via INTRAVENOUS

## 2024-04-15 MED ORDER — DIPHENHYDRAMINE HCL 25 MG PO CAPS
25.0000 mg | ORAL_CAPSULE | ORAL | Status: DC | PRN
  Filled 2024-04-15: qty 1

## 2024-04-15 MED ORDER — CHLORHEXIDINE GLUCONATE 0.12 % MT SOLN
15.0000 mL | Freq: Once | OROMUCOSAL | Status: AC
  Administered 2024-04-15: 15 mL via OROMUCOSAL
  Filled 2024-04-15: qty 15

## 2024-04-15 MED ORDER — ONDANSETRON HCL 4 MG/2ML IJ SOLN: 4.0000 mg | Freq: Three times a day (TID) | INTRAMUSCULAR | Status: DC | PRN

## 2024-04-15 MED ORDER — COCONUT OIL OIL: 1.0000 | TOPICAL_OIL | Status: DC | PRN

## 2024-04-15 MED ORDER — MORPHINE SULFATE (PF) 2 MG/ML IV SOLN: 1.0000 mg | INTRAVENOUS | Status: DC | PRN

## 2024-04-15 MED ORDER — PHENYLEPHRINE HCL-NACL 20-0.9 MG/250ML-% IV SOLN
INTRAVENOUS | Status: DC | PRN
  Administered 2024-04-15: 50 ug/min via INTRAVENOUS

## 2024-04-15 MED ORDER — MENTHOL 3 MG MT LOZG: 1.0000 | LOZENGE | OROMUCOSAL | Status: DC | PRN

## 2024-04-15 MED ORDER — LACTATED RINGERS IV SOLN: Freq: Once | INTRAVENOUS | Status: AC

## 2024-04-15 MED ORDER — KETOROLAC TROMETHAMINE 30 MG/ML IJ SOLN
30.0000 mg | Freq: Four times a day (QID) | INTRAMUSCULAR | Status: AC
  Administered 2024-04-15 – 2024-04-16 (×4): 30 mg via INTRAVENOUS
  Filled 2024-04-15 (×4): qty 1

## 2024-04-15 MED ORDER — CLONAZEPAM 0.5 MG PO TABS
0.5000 mg | ORAL_TABLET | Freq: Two times a day (BID) | ORAL | Status: DC | PRN
  Administered 2024-04-15: 0.5 mg via ORAL
  Filled 2024-04-15: qty 1

## 2024-04-15 MED ORDER — BUPIVACAINE HCL (PF) 0.25 % IJ SOLN
INTRAMUSCULAR | Status: AC
  Filled 2024-04-15: qty 20

## 2024-04-15 MED ORDER — WITCH HAZEL-GLYCERIN EX PADS: 1.0000 | MEDICATED_PAD | CUTANEOUS | Status: DC | PRN

## 2024-04-15 MED ORDER — IBUPROFEN 600 MG PO TABS
600.0000 mg | ORAL_TABLET | Freq: Four times a day (QID) | ORAL | Status: DC
  Administered 2024-04-16 – 2024-04-17 (×4): 600 mg via ORAL
  Filled 2024-04-15 (×4): qty 1

## 2024-04-15 MED ORDER — CEFAZOLIN SODIUM-DEXTROSE 3-4 GM/150ML-% IV SOLN
3.0000 g | INTRAVENOUS | Status: AC
  Administered 2024-04-15: 3 g via INTRAVENOUS
  Filled 2024-04-15: qty 150

## 2024-04-15 MED ORDER — ONDANSETRON HCL 4 MG/2ML IJ SOLN
INTRAMUSCULAR | Status: DC | PRN
  Administered 2024-04-15: 4 mg via INTRAVENOUS

## 2024-04-15 MED ORDER — PRENATAL MULTIVITAMIN CH
1.0000 | ORAL_TABLET | Freq: Every day | ORAL | Status: DC
  Administered 2024-04-16 – 2024-04-17 (×2): 1 via ORAL
  Filled 2024-04-15 (×2): qty 1

## 2024-04-15 MED ORDER — ZOLPIDEM TARTRATE 5 MG PO TABS: 5.0000 mg | ORAL_TABLET | Freq: Every evening | ORAL | Status: DC | PRN

## 2024-04-15 MED ORDER — DEXAMETHASONE SOD PHOSPHATE PF 10 MG/ML IJ SOLN
INTRAMUSCULAR | Status: DC | PRN
  Administered 2024-04-15: 10 mg via INTRAVENOUS

## 2024-04-15 MED ORDER — OXYCODONE HCL 5 MG PO TABS
5.0000 mg | ORAL_TABLET | ORAL | Status: DC | PRN
  Administered 2024-04-16 – 2024-04-17 (×4): 5 mg via ORAL
  Filled 2024-04-15 (×4): qty 1

## 2024-04-15 MED ORDER — MEPERIDINE HCL 25 MG/ML IJ SOLN: 6.2500 mg | INTRAMUSCULAR | Status: DC | PRN

## 2024-04-15 MED ORDER — DIPHENHYDRAMINE HCL 50 MG/ML IJ SOLN
INTRAMUSCULAR | Status: AC
  Filled 2024-04-15: qty 1

## 2024-04-15 MED ORDER — DEXAMETHASONE SOD PHOSPHATE PF 10 MG/ML IJ SOLN
INTRAMUSCULAR | Status: AC
  Filled 2024-04-15: qty 1

## 2024-04-15 MED ORDER — FENTANYL CITRATE (PF) 100 MCG/2ML IJ SOLN: 25.0000 ug | INTRAMUSCULAR | Status: DC | PRN

## 2024-04-15 MED ORDER — SOD CITRATE-CITRIC ACID 500-334 MG/5ML PO SOLN
30.0000 mL | ORAL | Status: AC
  Administered 2024-04-15: 30 mL via ORAL
  Filled 2024-04-15: qty 30

## 2024-04-15 MED ORDER — ONDANSETRON HCL 4 MG/2ML IJ SOLN: 4.0000 mg | Freq: Once | INTRAMUSCULAR | Status: DC | PRN

## 2024-04-15 MED ORDER — DIPHENHYDRAMINE HCL 50 MG/ML IJ SOLN
12.5000 mg | INTRAMUSCULAR | Status: DC | PRN
  Administered 2024-04-15: 12.5 mg via INTRAVENOUS

## 2024-04-15 MED ORDER — OXYCODONE HCL 5 MG PO TABS: 5.0000 mg | ORAL_TABLET | Freq: Four times a day (QID) | ORAL | Status: DC | PRN

## 2024-04-15 MED ORDER — SODIUM CHLORIDE 0.9% FLUSH: 3.0000 mL | INTRAVENOUS | Status: DC | PRN

## 2024-04-15 MED ORDER — GABAPENTIN 300 MG PO CAPS: 300.0000 mg | ORAL_CAPSULE | Freq: Every day | ORAL | Status: DC

## 2024-04-15 MED ORDER — FENTANYL CITRATE (PF) 100 MCG/2ML IJ SOLN
INTRAMUSCULAR | Status: AC
  Filled 2024-04-15: qty 2

## 2024-04-15 MED ORDER — BUPIVACAINE IN DEXTROSE 0.75-8.25 % IT SOLN
INTRATHECAL | Status: DC | PRN
  Administered 2024-04-15: 1.5 mL via INTRATHECAL

## 2024-04-15 MED ORDER — FENTANYL CITRATE (PF) 100 MCG/2ML IJ SOLN
INTRAMUSCULAR | Status: DC | PRN
  Administered 2024-04-15: 15 ug via INTRATHECAL

## 2024-04-15 MED ORDER — MORPHINE SULFATE (PF) 0.5 MG/ML IJ SOLN
INTRAMUSCULAR | Status: DC | PRN
  Administered 2024-04-15: .1 mg via INTRATHECAL

## 2024-04-15 MED ORDER — SERTRALINE HCL 25 MG PO TABS
50.0000 mg | ORAL_TABLET | Freq: Every day | ORAL | Status: DC
  Administered 2024-04-16 – 2024-04-17 (×2): 50 mg via ORAL
  Filled 2024-04-15 (×2): qty 2

## 2024-04-15 MED ORDER — DIPHENHYDRAMINE HCL 25 MG PO CAPS
25.0000 mg | ORAL_CAPSULE | Freq: Four times a day (QID) | ORAL | Status: DC | PRN
  Administered 2024-04-15: 25 mg via ORAL

## 2024-04-15 MED ORDER — KETOROLAC TROMETHAMINE 30 MG/ML IJ SOLN
30.0000 mg | Freq: Four times a day (QID) | INTRAMUSCULAR | Status: AC | PRN
  Administered 2024-04-15: 30 mg via INTRAVENOUS
  Filled 2024-04-15: qty 1

## 2024-04-15 MED ORDER — MORPHINE SULFATE (PF) 0.5 MG/ML IJ SOLN
INTRAMUSCULAR | Status: AC
  Filled 2024-04-15: qty 10

## 2024-04-15 MED ORDER — ACETAMINOPHEN 500 MG PO TABS
1000.0000 mg | ORAL_TABLET | Freq: Four times a day (QID) | ORAL | Status: DC
  Administered 2024-04-15 – 2024-04-17 (×9): 1000 mg via ORAL
  Filled 2024-04-15 (×9): qty 2

## 2024-04-15 MED ORDER — BUPIVACAINE HCL (PF) 0.25 % IJ SOLN
INTRAMUSCULAR | Status: AC
  Filled 2024-04-15: qty 60

## 2024-04-15 MED ORDER — SENNOSIDES-DOCUSATE SODIUM 8.6-50 MG PO TABS
2.0000 | ORAL_TABLET | Freq: Every day | ORAL | Status: DC
  Administered 2024-04-16 – 2024-04-17 (×2): 2 via ORAL
  Filled 2024-04-15 (×2): qty 2

## 2024-04-15 MED ORDER — KETOROLAC TROMETHAMINE 30 MG/ML IJ SOLN
INTRAMUSCULAR | Status: AC
  Filled 2024-04-15: qty 1

## 2024-04-15 MED ORDER — ENOXAPARIN SODIUM 80 MG/0.8ML IJ SOSY
65.0000 mg | PREFILLED_SYRINGE | INTRAMUSCULAR | Status: DC
  Administered 2024-04-16 – 2024-04-17 (×2): 65 mg via SUBCUTANEOUS
  Filled 2024-04-15 (×3): qty 0.65

## 2024-04-15 MED ORDER — OXYTOCIN-SODIUM CHLORIDE 30-0.9 UT/500ML-% IV SOLN
INTRAVENOUS | Status: DC | PRN
  Administered 2024-04-15: 400 mL via INTRAVENOUS

## 2024-04-15 MED ORDER — ONDANSETRON HCL 4 MG/2ML IJ SOLN
INTRAMUSCULAR | Status: AC
  Filled 2024-04-15: qty 2

## 2024-04-15 MED ORDER — LIDOCAINE HCL (PF) 1 % IJ SOLN
INTRAMUSCULAR | Status: DC | PRN
  Administered 2024-04-15: 2 mL via SUBCUTANEOUS

## 2024-04-15 MED ORDER — NALOXONE HCL 0.4 MG/ML IJ SOLN: 0.4000 mg | INTRAMUSCULAR | Status: DC | PRN

## 2024-04-15 NOTE — Progress Notes (Signed)
 Patient ID: Molly Morton, female   DOB: 03/28/1990, 34 y.o.   MRN: [id number] Pt here for repeat LTCS . I am performing instead of Dr Leonce based on inclimate weather . Declines BTL . NPO all questions answered . Proceed

## 2024-04-15 NOTE — Transfer of Care (Signed)
 Immediate Anesthesia Transfer of Care Note  Patient: Molly Morton  Procedure(s) Performed: CESAREAN DELIVERY  Patient Location: PACU  Anesthesia Type:Spinal  Level of Consciousness: awake, alert , and oriented  Airway & Oxygen Therapy: Patient Spontanous Breathing and Patient connected to nasal cannula oxygen  Post-op Assessment: Post -op Vital signs reviewed and stable  Post vital signs: Reviewed and stable  Last Vitals:  Vitals Value Taken Time  BP    Temp    Pulse    Resp    SpO2      Last Pain:  Vitals:   04/15/24 0705  TempSrc:   PainSc: 0-No pain         Complications: No notable events documented.

## 2024-04-15 NOTE — Anesthesia Preprocedure Evaluation (Signed)
"                                    Anesthesia Evaluation  Patient identified by MRN, date of birth, ID band Patient awake    Reviewed: Allergy & Precautions, H&P , NPO status , Patient's Chart, lab work & pertinent test results, reviewed documented beta blocker date and time   History of Anesthesia Complications (+) PONV and history of anesthetic complications  Airway Mallampati: I  TM Distance: >3 FB Neck ROM: full    Dental no notable dental hx. (+) Caps   Pulmonary neg pulmonary ROS   Pulmonary exam normal breath sounds clear to auscultation       Cardiovascular Exercise Tolerance: Good negative cardio ROS Normal cardiovascular exam Rhythm:regular Rate:Normal     Neuro/Psych  PSYCHIATRIC DISORDERS Anxiety     negative neurological ROS     GI/Hepatic Neg liver ROS,GERD  Controlled,,  Endo/Other  neg diabetes  Class 3 obesity  Renal/GU negative Renal ROS  negative genitourinary   Musculoskeletal   Abdominal   Peds  Hematology negative hematology ROS (+)   Anesthesia Other Findings Past Medical History: No date: Anxiety No date: GERD (gastroesophageal reflux disease) No date: PONV (postoperative nausea and vomiting)     Comment:  with C-section   Reproductive/Obstetrics negative OB ROS                              Anesthesia Physical Anesthesia Plan  ASA: 3  Anesthesia Plan: Spinal   Post-op Pain Management:    Induction:   PONV Risk Score and Plan:   Airway Management Planned: Natural Airway and Simple Face Mask  Additional Equipment:   Intra-op Plan:   Post-operative Plan:   Informed Consent: I have reviewed the patients History and Physical, chart, labs and discussed the procedure including the risks, benefits and alternatives for the proposed anesthesia with the patient or authorized representative who has indicated his/her understanding and acceptance.     Dental Advisory Given  Plan  Discussed with: Anesthesiologist, CRNA and Surgeon  Anesthesia Plan Comments:         Anesthesia Quick Evaluation  "

## 2024-04-15 NOTE — Discharge Summary (Signed)
 Obstetrical Discharge Summary  Patient Name: Molly Morton DOB: 22-Oct-1990 MRN: [id number]  Date of Admission: 04/15/2024 Date of Delivery: 04/15/24 Delivered by: ONEIDA Dinsmore MD Date of Discharge: 04/17/2024  Primary OB: [facility] OFE:Ejupzwu'd last menstrual period was 07/16/2023 (approximate). EDC Estimated Date of Delivery: 04/21/24 Gestational Age at Delivery: [redacted]w[redacted]d   Antepartum complications: prior cesarean, obesity  Admitting Diagnosis:  Secondary Diagnosis: Patient Active Problem List   Diagnosis Date Noted   History of cesarean delivery 04/15/2024   Headache in pregnancy, antepartum 02/23/2024   Lower back pain 12/20/2023   Post-operative state 08/21/2018   Labor and delivery indication for care or intervention 08/20/2018    Augmentation: N/A Complications: None Intrapartum complications/course: underwent an uncomplicated repeat cesarean  Date of Delivery: 04/17/2024  Delivered By: IVAR Dinsmore MD Delivery Type: repeat cesarean section, low transverse incision Anesthesia: spinal Placenta:manual Laceration:  Episiotomy: none Newborn Data: Female  Apgars8/9  Weight #8/0  Postpartum Procedures: none  Edinburgh:     04/16/2024    4:42 PM 08/22/2018    9:19 PM  Edinburgh Postnatal Depression Scale Screening Tool  I have been able to laugh and see the funny side of things. 0 0   I have looked forward with enjoyment to things. 0 0   I have blamed myself unnecessarily when things went wrong. 0 1   I have been anxious or worried for no good reason. 2 0   I have felt scared or panicky for no good reason. 2 0   Things have been getting on top of me. 0 0   I have been so unhappy that I have had difficulty sleeping. 0 0   I have felt sad or miserable. 0 0   I have been so unhappy that I have been crying. 0 0   The thought of harming myself has occurred to me. 0 0   Edinburgh Postnatal Depression Scale Total 4 1      Data saved with a previous  flowsheet row definition    Post partum course: Patient had an uncomplicated postpartum course.  By time of discharge on POD#2, her pain was controlled on oral pain medications; she had appropriate lochia and was ambulating, voiding without difficulty, tolerating regular diet and passing flatus.   She was deemed stable for discharge to home.    Discharge Physical Exam:   BP 125/69 (BP Location: Left Arm)   Pulse 78   Temp 98.5 F (36.9 C) (Oral)   Resp 18   Ht 5' 6 (1.676 m)   Wt 133.4 kg   LMP 07/16/2023 (Approximate)   SpO2 97%   Breastfeeding No   BMI 47.45 kg/m   General: NAD CV: RRR Pulm: CTABL, nl effort ABD: s/nd/nt, fundus firm and below the umbilicus Lochia: moderate Incision: c/d/i DVT Evaluation: LE non-ttp, no evidence of DVT on exam.  Hemoglobin  Date Value Ref Range Status  04/16/2024 9.1 (L) 12.0 - 15.0 g/dL Final   HCT  Date Value Ref Range Status  04/16/2024 27.6 (L) 36.0 - 46.0 % Final     Disposition: stable, discharge to home. Baby Feeding: formula Baby Disposition: home with mom  Rh Immune globulin given: n/a, O pos Rubella vaccine given: n/a, immune Tdap vaccine given in AP or PP setting: received AP Flu vaccine given in AP or PP setting: received AP  Contraception: undecided Risk assessment for postpartum VTE and prophylactic treatment: Very high risk factors: None High risk factors: None Moderate risk factors: Cesarean  delivery  and BMI 40-60 kg/m2  Postpartum VTE prophylaxis with LMWH not indicated  Prenatal Labs:   Blood type/Rh O positive  Antibody screen negative  Rubella Immune  Varicella Immune    RPR NR (10/04/2023, 01/29/2024)  HBsAg negative  Hep C negative  HIV Negative (10/04/2023, 01/29/2024)  GC negative  Chlamydia negative  Genetic screening Diploid XX Garlan)  1 hour GTT 172  3 hour GTT (85, 191, 114, 36)  GBS negative     Plan:  Molly Morton was discharged to home in good condition. Follow-up  appointment with delivering provider in 6 weeks.  Discharge Medications: Allergies as of 04/17/2024   No Known Allergies      Medication List     TAKE these medications    acetaminophen  500 MG tablet Commonly known as: TYLENOL  Take 2 tablets (1,000 mg total) by mouth every 6 (six) hours as needed for mild pain (pain score 1-3) or fever.   clonazePAM  1 MG tablet Commonly known as: KLONOPIN  Take 1 mg by mouth 2 (two) times daily as needed for anxiety.   ibuprofen  600 MG tablet Commonly known as: ADVIL  Take 1 tablet (600 mg total) by mouth every 6 (six) hours as needed for fever, mild pain (pain score 1-3) or cramping.   oxyCODONE  5 MG immediate release tablet Commonly known as: Oxy IR/ROXICODONE  Take 1-2 tablets (5-10 mg total) by mouth every 6 (six) hours as needed for up to 7 days for severe pain (pain score 7-10).   prenatal multivitamin Tabs tablet Take 1 tablet by mouth daily at 12 noon.   senna-docusate 8.6-50 MG tablet Commonly known as: Senokot-S Take 2 tablets by mouth at bedtime as needed for mild constipation.   sertraline  50 MG tablet Commonly known as: ZOLOFT  Take 50 mg by mouth every evening.   simethicone  80 MG chewable tablet Commonly known as: MYLICON Chew 1 tablet (80 mg total) by mouth as needed for flatulence.         Follow-up Information     Laycie Schriner, Debby PARAS, MD Follow up in 2 week(s).   Specialty: Obstetrics and Gynecology Why: 2wk incision check Contact information: [address] [place] [address] [phone]                 Signed:  Edsel Charlies Blush, CNM 04/17/2024 10:10 AM

## 2024-04-15 NOTE — Op Note (Unsigned)
 Molly Morton, Molly Morton MEDICAL RECORD NO: [id number] ACCOUNT NO: [id number] DATE OF BIRTH: 04/19/1990 FACILITY: [facility] LOCATION: [facility] PHYSICIAN: Debby DOROTHA Dinsmore, MD  Operative Report   DATE OF PROCEDURE: 04/15/2024  PREOPERATIVE DIAGNOSES:  1.  39 plus 1 weeks estimated gestational age.  2.  Elective repeat cesarean section.  POSTOPERATIVE DIAGNOSES:  1.  39 plus 1 weeks estimated gestational age.  2.  Elective repeat cesarean section.  3.  Viable female delivered.  PROCEDURE:  Repeat low transverse cesarean section.  SURGEON:  Debby DOROTHA Dinsmore, MD  FIRST ASSISTANT:  Delon Coe, certified nurse midwife.  ANESTHESIA:  Spinal.  INDICATIONS:  34 year old G3 P1 patient with a prior cesarean section and elects for a repeat cesarean section.  Estimated gestational age 53 plus 1 weeks.  DESCRIPTION OF PROCEDURE:  After adequate spinal anesthesia, the patient was placed in dorsal supine position with a hip roll on the right side.  The patient's abdomen, perineum, and vagina were prepped and draped in normal sterile fashion.  A Foley  catheter had been previously placed.  The patient did receive 3 g IV Ancef  for surgical prophylaxis.  Timeout was performed.  A Pfannenstiel incision was made 2 fingerbreadths above the symphysis pubis.  Sharp dissection was used to identify the fascia.  The fascia was opened in the midline and opened in a transverse fashion.  The superior aspect of the fascia was grasped with  Kocher clamps and the recti muscles were dissected free.  The inferior aspect of the fascia was grasped with Kocher clamps and the pyramidalis muscles dissected free.  Entry into the peritoneal cavity was accomplished bluntly.  There were omental  adhesions adhered to the anterior abdominal wall.  These were taken down with the Bovie.  One thicker band was doubly clamped, transected, and suture ligated with 0 Vicryl suture.  A direct low uterine incision was made.  A  transverse uterine incision was made.  Upon entry into the endometrial cavity, clear fluid resulted.  The uterine incision was extended with blunt transverse traction.  The large fetal head was brought to the  incision and a Kiwi vacuum was placed at the occiput.  With one pull, the head was delivered and the vacuum was removed.  Shoulders and body were delivered after reducing a loose nuchal cord.  The vigorous female then was dried on the mother's abdomen  for 60 seconds and the cord was doubly clamped and the vigorous female was passed to nursery staff for side, Apgar scores of 8 and 9, fetal weight 8 pounds 0 ounces.  Of note, certified nurse midwife [place]'s expertise with retraction and fundal pressure  during the procedure was needed for the delivery.  The placenta was manually delivered and the uterus was exteriorized.  The endometrial cavity was wiped clean with a laparotomy tape.  And the uterine incision was closed with 1-0 chromic suture in a running locking fashion.  Two additional  figure-of-eight sutures were required for good hemostasis.  Fallopian tubes and ovaries appeared normal.  The posterior cul-de-sac was irrigated and suctioned, and the uterus was placed back into the abdominal cavity.  The paracolic gutters were wiped  clean with laparotomy tape.  The uterine incision again appeared hemostatic.  The fascia was then closed with 0 Vicryl suture in a running non-locking fashion.  Two separate sutures were used.  The fascial edges were injected with 30 mL of 0.25% Marcaine .  The subcutaneous tissues were irrigated and Bovie'd.  Given the depth  of  the subcutaneous tissues of 5 cm, the subcutaneous dead space was closed with a running 2-0 chromic suture.  The skin was reapproximated with Insorb staples and an additional 20 mL of Marcaine  solution was injected beneath the skin.  There were no  complications.  The patient did receive 30 mg intravenous Toradol  at the end of the procedure.   There were no complications.  QUANTITATIVE BLOOD LOSS:  415 mL.  INTRAOPERATIVE FLUIDS:  1200 mL.  URINE OUTPUT:  200 mL.  DISPOSITION:  The patient was taken to recovery room in good condition.     D: 04/15/2024 10:01:47 am T: 04/15/2024 10:03:00 am  JOB: [id number]/ [id number]

## 2024-04-15 NOTE — Anesthesia Procedure Notes (Signed)
 Spinal  Patient location during procedure: OR Start time: 04/15/2024 8:05 AM End time: 04/15/2024 8:08 AM Reason for block: surgical anesthesia  Staffing Performed: resident/CRNA  Authorized by: Dario Barter, MD   Performed by: Duwayne Craven, CRNA  Preanesthetic Checklist Completed: patient identified, IV checked, site marked, risks and benefits discussed, surgical consent, monitors and equipment checked, pre-op evaluation and timeout performed Spinal Block Patient position: sitting Prep: Betadine Patient monitoring: heart rate, continuous pulse ox, blood pressure and cardiac monitor Approach: midline Location: L3-4 Injection technique: single-shot Needle Needle type: Whitacre and Introducer  Needle gauge: 24 G Needle length: 9 cm Assessment Sensory level: T4 Events: CSF return  Additional Notes Negative paresthesia. Negative blood return. Positive free-flowing CSF. Expiration date of kit checked and confirmed. Patient tolerated procedure well, without complications.

## 2024-04-15 NOTE — Brief Op Note (Signed)
 04/15/2024  7:50 AM  9:22 AM  PATIENT:  Molly Morton  34 y.o. female  PRE-OPERATIVE DIAGNOSIS: elective repeat cesarean 39+[redacted] weeks EGA  POST-OPERATIVE DIAGNOSIS:same as above Viable female  PROCEDURE:  Procedures: CESAREAN DELIVERY (N/A) Repeat LTCS  SURGEON:  Surgeons and Role:    * Poppi Scantling, Debby PARAS, MD - Primary  PHYSICIAN ASSISTANT: CNM Oxley  ASSISTANTS: none   ANESTHESIA:   spinal  EBL:  QBL 415 mL iof 1200 cc uo 200 cc  BLOOD ADMINISTERED:none  DRAINS: Urinary Catheter (Foley)   LOCAL MEDICATIONS USED:  MARCAINE      SPECIMEN:  No Specimen  DISPOSITION OF SPECIMEN:  N/A  COUNTS:  YES  TOURNIQUET:  * No tourniquets in log *  DICTATION: .Other Dictation: Dictation Number verbal  PLAN OF CARE: Admit to inpatient   PATIENT DISPOSITION:  PACU - hemodynamically stable.   Delay start of Pharmacological VTE agent (>24hrs) due to surgical blood loss or risk of bleeding: not applicable

## 2024-04-15 NOTE — Anesthesia Procedure Notes (Signed)
 Date/Time: 04/15/2024 8:07 AM  Performed by: Duwayne Craven, CRNAPre-anesthesia Checklist: Patient identified, Emergency Drugs available, Suction available, Patient being monitored and Timeout performed Patient Re-evaluated:Patient Re-evaluated prior to induction Oxygen Delivery Method: Nasal cannula Induction Type: IV induction Placement Confirmation: CO2 detector and positive ETCO2

## 2024-04-16 LAB — CBC
HCT: 27.6 % — ABNORMAL LOW (ref 36.0–46.0)
Hemoglobin: 9.1 g/dL — ABNORMAL LOW (ref 12.0–15.0)
MCH: 28.6 pg (ref 26.0–34.0)
MCHC: 33 g/dL (ref 30.0–36.0)
MCV: 86.8 fL (ref 80.0–100.0)
Platelets: 223 10*3/uL (ref 150–400)
RBC: 3.18 MIL/uL — ABNORMAL LOW (ref 3.87–5.11)
RDW: 14 % (ref 11.5–15.5)
WBC: 13.7 10*3/uL — ABNORMAL HIGH (ref 4.0–10.5)
nRBC: 0 % (ref 0.0–0.2)

## 2024-04-16 MED ORDER — FERROUS SULFATE 325 (65 FE) MG PO TABS
325.0000 mg | ORAL_TABLET | Freq: Two times a day (BID) | ORAL | Status: DC
  Administered 2024-04-16 – 2024-04-17 (×2): 325 mg via ORAL
  Filled 2024-04-16 (×2): qty 1

## 2024-04-16 NOTE — Progress Notes (Signed)
 Post Partum Day 1  Subjective: Doing well, no concerns. Ambulating without difficulty, pain managed with PO meds, tolerating regular diet, and voiding without difficulty.   No fever/chills, chest pain, shortness of breath, nausea/vomiting, or leg pain. No nipple or breast pain. No headache, visual changes, or RUQ/epigastric pain.  Objective: BP 111/62 (BP Location: Left Arm)   Pulse 67   Temp 97.9 F (36.6 C) (Oral)   Resp 18   Ht 5' 6 (1.676 m)   Wt 133.4 kg   LMP 07/16/2023 (Approximate)   SpO2 98%   Breastfeeding No   BMI 47.45 kg/m    Physical Exam:  General: alert and cooperative Breasts: soft/nontender CV: RRR Pulm: nl effort Abdomen: soft, non-tender Uterine Fundus: firm Incision: healing well Perineum: intact Lochia: appropriate DVT Evaluation: No evidence of DVT seen on physical exam. Edinburgh:     08/22/2018    9:19 PM  [place] Postnatal Depression Scale Screening Tool  I have been able to laugh and see the funny side of things. 0   I have looked forward with enjoyment to things. 0   I have blamed myself unnecessarily when things went wrong. 1   I have been anxious or worried for no good reason. 0   I have felt scared or panicky for no good reason. 0   Things have been getting on top of me. 0   I have been so unhappy that I have had difficulty sleeping. 0   I have felt sad or miserable. 0   I have been so unhappy that I have been crying. 0   The thought of harming myself has occurred to me. 0   Edinburgh Postnatal Depression Scale Total 1      Data saved with a previous flowsheet row definition     Recent Labs    04/16/24 0551  HGB 9.1*  HCT 27.6*  WBC 13.7*  PLT 223    Assessment/Plan: 33 y.o. G2P2002 postpartum day # 1  1. Continue routine postpartum care  2. Infant feeding status: breast feeding -Lactation consult PRN for breastfeeding   3. Contraception plan: TBD  4. Acute blood loss anemia - clinically not significant .   -Hemodynamically stable and asymptomatic -Intervention: start on oral supplementation with ferrous sulfate  325 mg   5. Immunization status:   all immunizations up to date  Disposition: Continue inpatient postpartum care    LOS: 1 day   Jalaiyah Throgmorton, CNM 04/16/2024, 9:00 AM

## 2024-04-16 NOTE — Anesthesia Post-op Follow-up Note (Signed)
" °  Anesthesia Pain Follow-up Note  Patient: Molly Morton  Day #: 1  Date of Follow-up: 04/16/2024 Time: 7:30 AM  Last Vitals:  Vitals:   04/16/24 0300 04/16/24 0500  BP:    Pulse: 73 67  Resp:    Temp:    SpO2: 95% 92%    Level of Consciousness: alert  Pain: none   Side Effects:None  Catheter Site Exam:clean, dry, no drainage  Anti-Coag Meds (From admission, onward)    Start     Dose/Rate Route Frequency Ordered Stop   04/16/24 0800  enoxaparin  (LOVENOX ) injection 65 mg        65 mg Subcutaneous Every 24 hours 04/15/24 1050          Plan: D/C from anesthesia care at surgeon's request  Emelie Newsom B Lacretia     "

## 2024-04-16 NOTE — Anesthesia Postprocedure Evaluation (Signed)
"   Anesthesia Post Note  Patient: Molly Morton  Procedure(s) Performed: CESAREAN DELIVERY  Patient location during evaluation: Mother Baby Anesthesia Type: Spinal Level of consciousness: oriented and awake and alert Pain management: pain level controlled Vital Signs Assessment: post-procedure vital signs reviewed and stable Respiratory status: spontaneous breathing and respiratory function stable Cardiovascular status: blood pressure returned to baseline and stable Postop Assessment: no headache, no backache, no apparent nausea or vomiting and able to ambulate Anesthetic complications: no   No notable events documented.   Last Vitals:  Vitals:   04/16/24 0300 04/16/24 0500  BP:    Pulse: 73 67  Resp:    Temp:    SpO2: 95% 92%    Last Pain:  Vitals:   04/15/24 2322  TempSrc: Oral  PainSc:                  [name]      "

## 2024-04-17 MED ORDER — SENNOSIDES-DOCUSATE SODIUM 8.6-50 MG PO TABS: 2.0000 | ORAL_TABLET | Freq: Every evening | ORAL | 0 refills | Status: AC | PRN

## 2024-04-17 MED ORDER — SIMETHICONE 80 MG PO CHEW: 80.0000 mg | CHEWABLE_TABLET | ORAL | 0 refills | Status: AC | PRN

## 2024-04-17 MED ORDER — IBUPROFEN 600 MG PO TABS: 600.0000 mg | ORAL_TABLET | Freq: Four times a day (QID) | ORAL | 0 refills | Status: AC | PRN

## 2024-04-17 MED ORDER — OXYCODONE HCL 5 MG PO TABS: 5.0000 mg | ORAL_TABLET | Freq: Four times a day (QID) | ORAL | 0 refills | Status: AC | PRN

## 2024-04-17 MED ORDER — ACETAMINOPHEN 500 MG PO TABS: 1000.0000 mg | ORAL_TABLET | Freq: Four times a day (QID) | ORAL | 0 refills | Status: AC | PRN

## 2024-04-17 NOTE — Plan of Care (Signed)
 Patient will be discharged home with husband. Discharge instructions, when to follow up, and prescriptions reviewed with patient.  Patient verbalized understanding. Patient will be escorted out by auxiliary.   Elyn Sharps, RN 04/17/24 @1051 

## 2024-04-17 NOTE — Discharge Instructions (Signed)

## 2024-04-18 ENCOUNTER — Encounter: Payer: Self-pay | Admitting: Obstetrics and Gynecology
